# Patient Record
Sex: Female | Born: 1985 | Race: Black or African American | Hispanic: No | Marital: Single | Smoking: Current some day smoker
Health system: Southern US, Community
[De-identification: ages and names within clinical notes are randomized; demographics above are authoritative.]

## PROBLEM LIST (undated history)

## (undated) ENCOUNTER — Ambulatory Visit: Admission: EM | Payer: 59

## (undated) DIAGNOSIS — M419 Scoliosis, unspecified: Secondary | ICD-10-CM

## (undated) DIAGNOSIS — B977 Papillomavirus as the cause of diseases classified elsewhere: Secondary | ICD-10-CM

## (undated) DIAGNOSIS — O1490 Unspecified pre-eclampsia, unspecified trimester: Secondary | ICD-10-CM

## (undated) DIAGNOSIS — O1420 HELLP syndrome (HELLP), unspecified trimester: Secondary | ICD-10-CM

## (undated) DIAGNOSIS — O24419 Gestational diabetes mellitus in pregnancy, unspecified control: Secondary | ICD-10-CM

## (undated) HISTORY — DX: Gestational diabetes mellitus in pregnancy, unspecified control: O24.419

## (undated) HISTORY — DX: Papillomavirus as the cause of diseases classified elsewhere: B97.7

## (undated) HISTORY — PX: COLPOSCOPY: SHX161

## (undated) HISTORY — DX: HELLP syndrome (HELLP), unspecified trimester: O14.20

---
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
## 2007-02-23 ENCOUNTER — Emergency Department: Payer: Self-pay | Admitting: Emergency Medicine

## 2008-01-24 ENCOUNTER — Emergency Department: Payer: Self-pay | Admitting: Emergency Medicine

## 2010-02-13 DIAGNOSIS — O1490 Unspecified pre-eclampsia, unspecified trimester: Secondary | ICD-10-CM

## 2010-02-13 HISTORY — DX: Unspecified pre-eclampsia, unspecified trimester: O14.90

## 2010-10-21 ENCOUNTER — Inpatient Hospital Stay: Payer: Self-pay | Admitting: Obstetrics and Gynecology

## 2011-09-01 ENCOUNTER — Observation Stay: Payer: Self-pay

## 2011-10-11 ENCOUNTER — Observation Stay: Payer: Self-pay

## 2011-10-11 LAB — URINALYSIS, COMPLETE
Bilirubin,UR: NEGATIVE
Glucose,UR: NEGATIVE mg/dL (ref 0–75)
Ketone: NEGATIVE
Nitrite: NEGATIVE
Protein: NEGATIVE
RBC,UR: 4 /HPF (ref 0–5)
Squamous Epithelial: 1
WBC UR: 18 /HPF (ref 0–5)

## 2013-03-22 IMAGING — CT CT CHEST W/ CM
2 series · 15 of 31 positions shown, 19 images · non-contrast
Comparison: none

REASON FOR EXAM: Tachycardia, desaturation
COMMENTS:

[Series 5: soft tissue · axial · 0.80mm/px · z∈[-412,-364]mm · 2 of 101 slices shown]
[im 8/101  mediastinal]
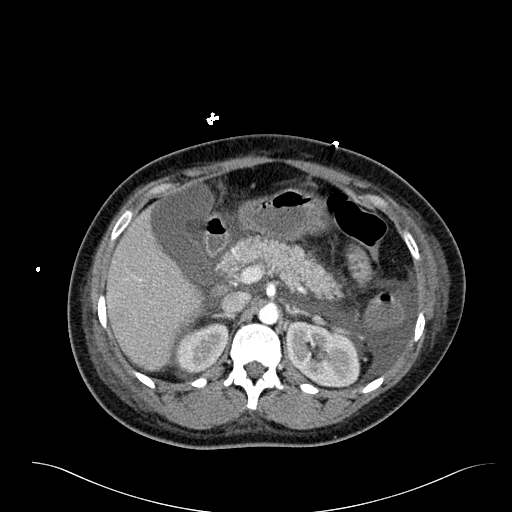
[im 24/101  mediastinal]
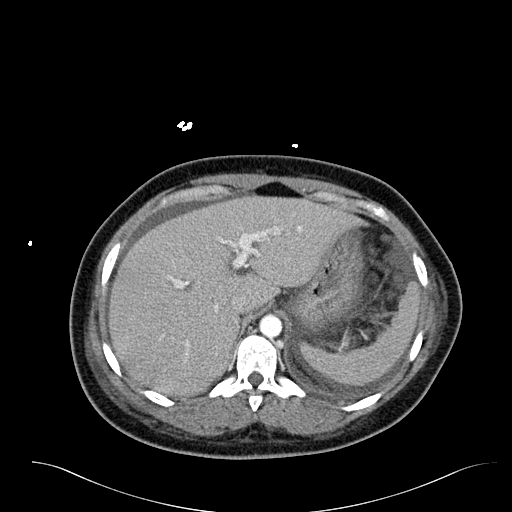

[Series 6: lung windows · axial · 0.80mm/px · z∈[-404,-158]mm · 13 of 98 slices shown, 17 images]
[im 8/98  mediastinal]
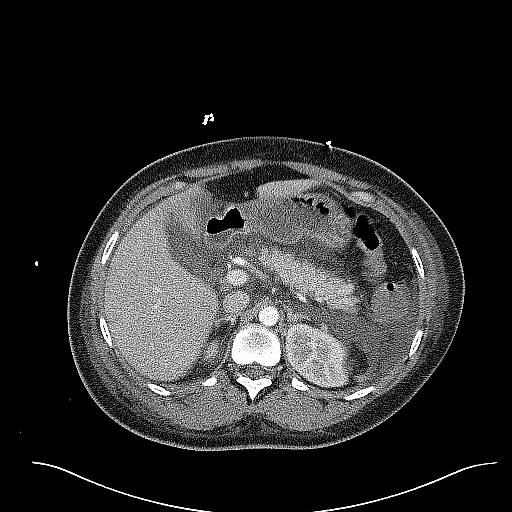
[im 8/98  lung]
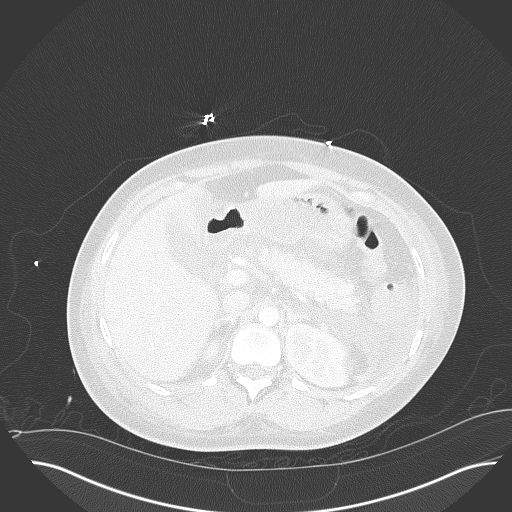
[im 15/98  lung]
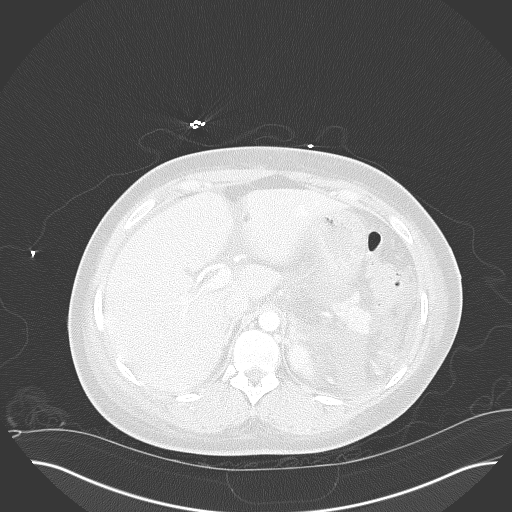
[im 23/98  lung]
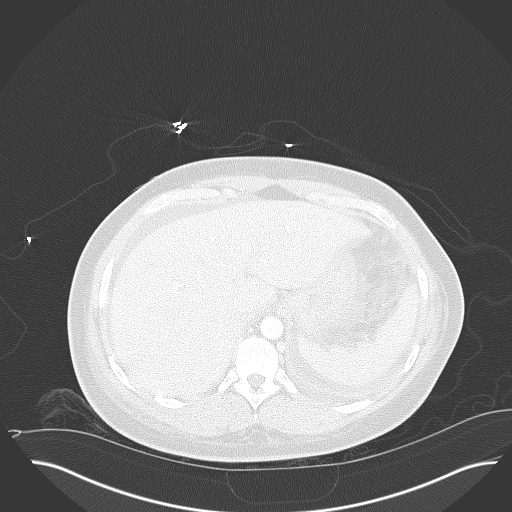
[im 30/98  lung]
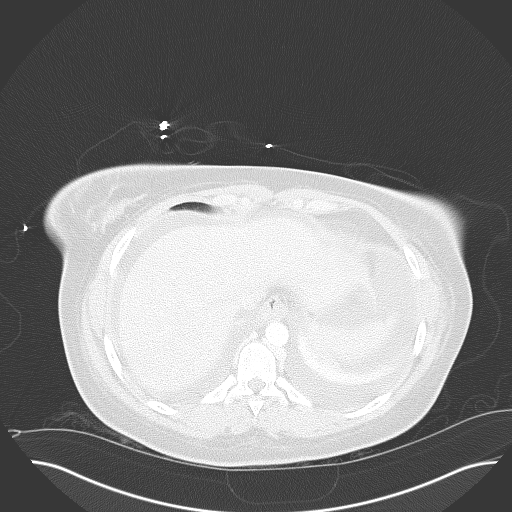
[im 38/98  mediastinal]
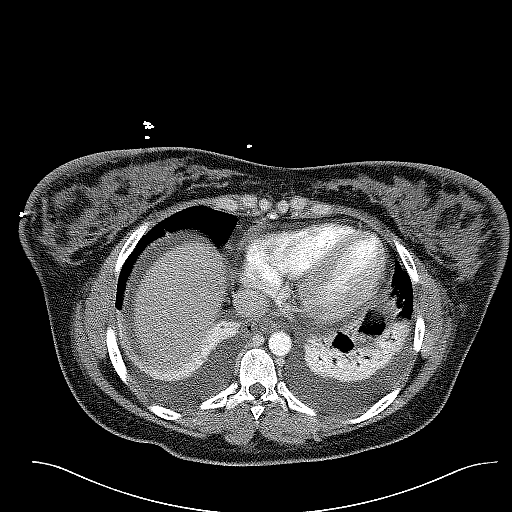
[im 38/98  lung]
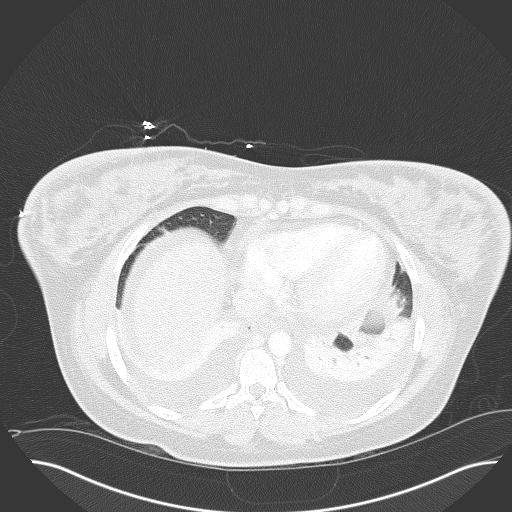
[im 45/98  lung]
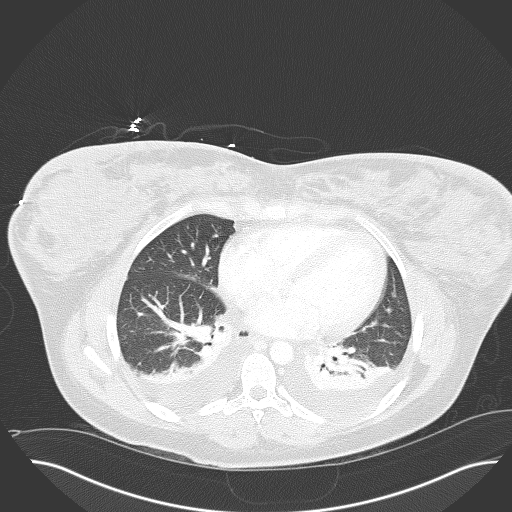
[im 49/98  lung]
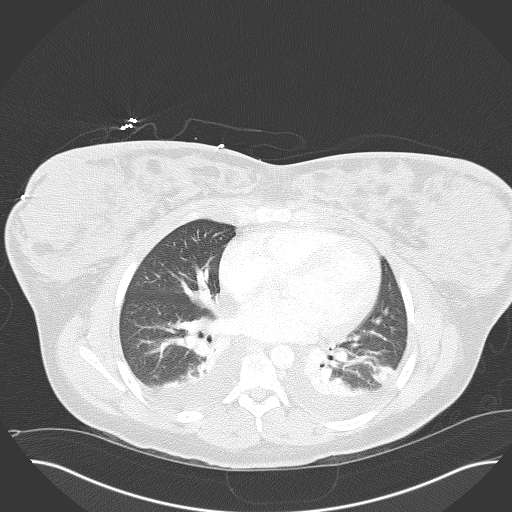
[im 53/98  lung]
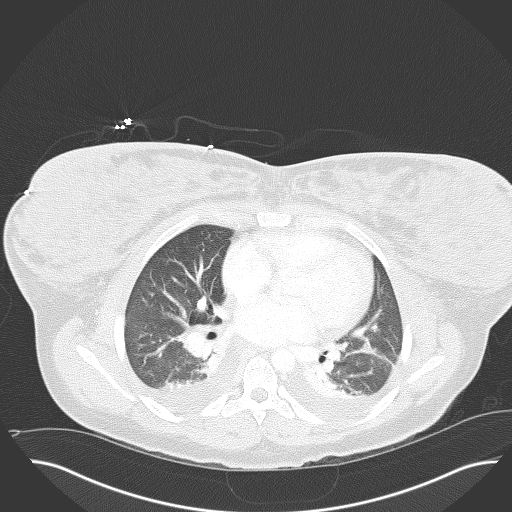
[im 60/98  mediastinal]
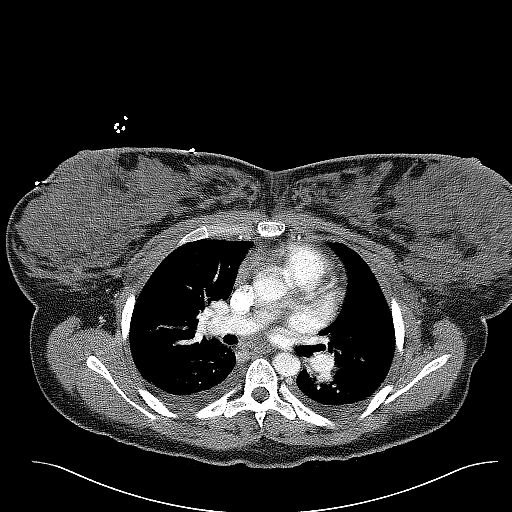
[im 60/98  lung]
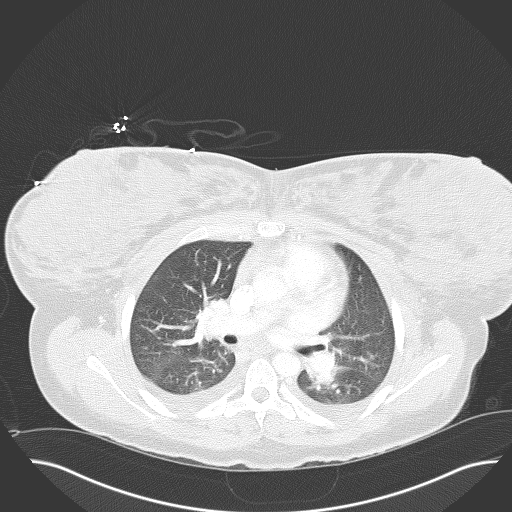
[im 68/98  lung]
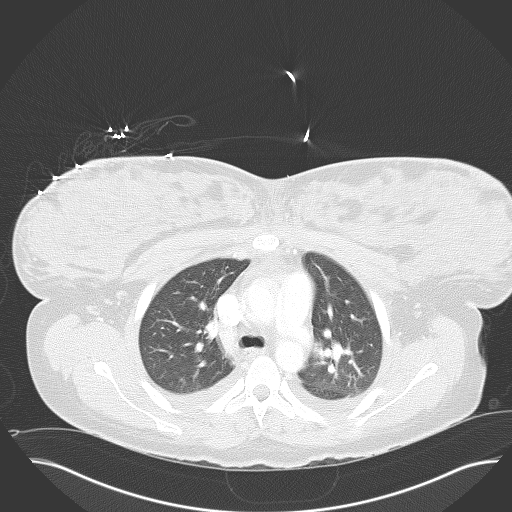
[im 75/98  lung]
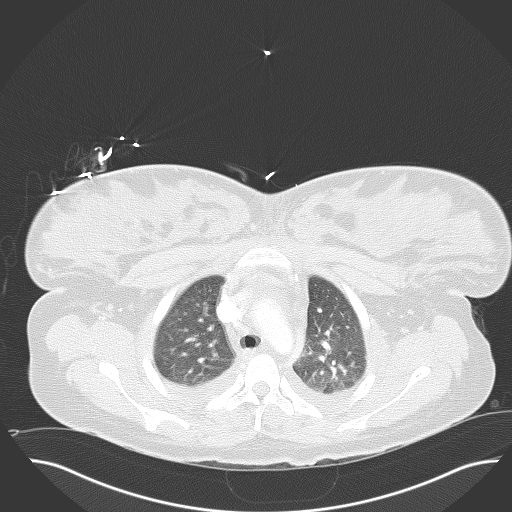
[im 83/98  lung]
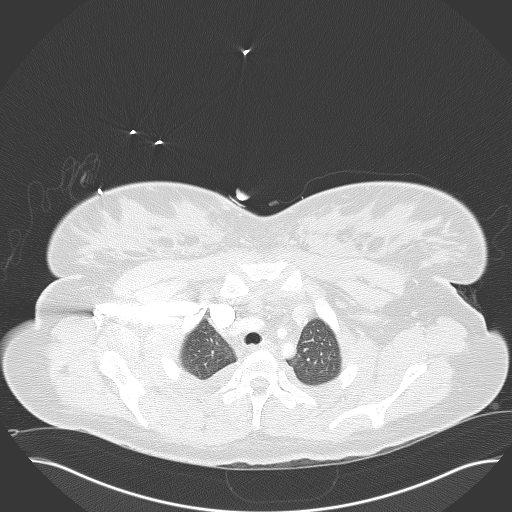
[im 90/98  mediastinal]
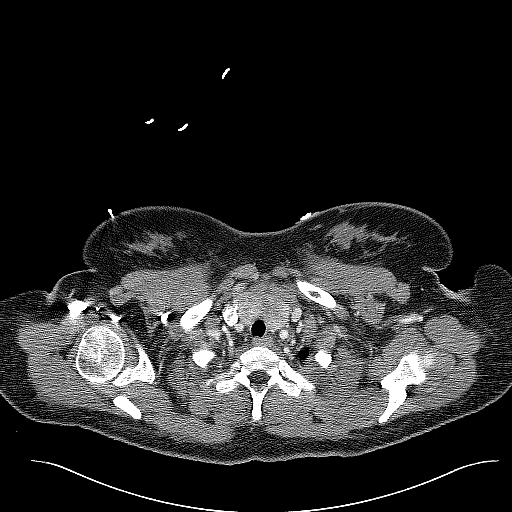
[im 90/98  lung]
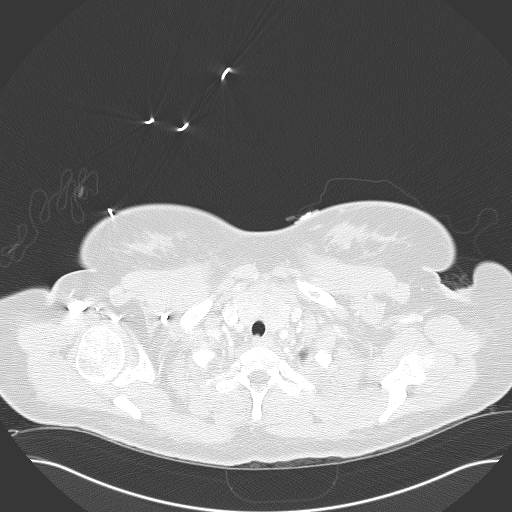

[15 of 31 positions shown; findings below may reference images not displayed]

PROCEDURE:     CT  - CT CHEST WITH CONTRAST  - October 24, 2010  [DATE]

RESULT:     Axial CT scanning was performed through the chest at 3 mm
intervals and slice thicknesses following intravenous administration of 100
cc of Osovue-N4M. Review of multiplanar reconstructed images was performed
separately on the VIA monitor.

Contrast within the pulmonary arterial tree is normal in appearance. I do
not see evidence of an acute pulmonary embolism. The cardiac chambers are
top normal in size. There are moderate sized bilateral pleural effusions.
There is no pericardial effusion. There is bibasilar atelectasis and/or
infiltrate in the lower lobes. I see no pathologic sized mediastinal or
hilar lymph nodes. The caliber of the thoracic aorta is normal. The thyroid
lobes are enlarged and exhibit heterogeneous density.

Within the upper abdomen there is ascites. There is mural enhancement of the
visualized portions of the small bowel. The abdominal aorta enhances well
and exhibits normal caliber. The celiac and superior mesenteric trunks
enhance well where visualized. The inferior vena cava demonstrates normal
enhancement for the early enhancement phase of the study. The observed
portions of the kidneys appear normal. The liver exhibits no focal mass
where visualized. The spleen is not enlarged. The gallbladder appears mildly
distended but I see no calcified stones. The pancreas is mildly prominent
but no discrete mass is identified. One cannot exclude a pancreatic
inflammation given the abdominal fluid.
IMPRESSION: 1. There is no evidence of an acute pulmonary embolism.
2. There are small to moderate sized bilateral pleural effusions with
bibasilar atelectasis versus pneumonia.
3. There is enlargement of the thyroid lobes with heterogeneous enhancement.
4. Within the abdomen there is free fluid. There is also mild mesenteric
edema and there is hyperenhancement of the small bowel which may reflect a
recent hypoperfusion state now with enhanced reperfusion.

A preliminary report was sent to the CCU at the conclusion of the study.

## 2014-05-14 ENCOUNTER — Emergency Department: Admit: 2014-05-14 | Disposition: A | Discharge status: Home / Self Care | Payer: Self-pay | Admitting: Student

## 2014-06-23 NOTE — H&P (Signed)
L&D Evaluation:  History:   HPI 29 yo G2P0100 at 6764w3d gestation by stated EDC of 12/19/2011 presenting with decreased fetal movement.  The patient is particularly concerned given her pregnancy history, she delivered an IUFD 10 months ago at 27 weeks, complicated by severe pre-eclampsia/HELLP syndrome.  [facility] [place].  So far the pregnancy has been uncomplicated per her reports but she has been on HCTZ 50mg  po daily for what sounds like suspected CHTN.  No ctx, no VB, no LOF    Presents with abdominal pain, back pain    Patient's Medical History CHTN?    Patient's Surgical History none    Medications Pre Natal Vitamins    Allergies NKDA    Social History none    Family History Non-Contributory   ROS:   ROS All systems were reviewed.  HEENT, CNS, GI, GU, Respiratory, CV, Renal and Musculoskeletal systems were found to be normal.   Exam:   Vital Signs stable  normotensive today    General no apparent distress    Mental Status clear    Abdomen gravid, non-tender    Estimated Fetal Weight Average for gestational age    Edema no edema    Reflexes 1+    Clonus negative    FHT 150, moderate, no accels, 2 small variables reassuring for 24 weeks   Impression:   Impression decreased fetal movement, reactive NST   Plan:   Plan EFM/NST    Comments - Reactive NST obtained today - Patient reports good movement on arrival to L&D and regular movement is heard during the course of the NST - Patient reassured, discussed given her history we do encourage her to come in early should she have any concerns - Patient has follow up with her primary OB in 3 days    Follow Up Appointment already scheduled   Electronic Signatures: Lorrene ReidStaebler, Ezekiel Menzer M (MD)  (Signed 19-Jul-13 12:43)  Authored: L&D Evaluation   Last Updated: 19-Jul-13 12:43 by Lorrene ReidStaebler, Keiona Jenison M (MD)

## 2014-10-19 DIAGNOSIS — J029 Acute pharyngitis, unspecified: Secondary | ICD-10-CM | POA: Insufficient documentation

## 2014-10-19 DIAGNOSIS — R05 Cough: Secondary | ICD-10-CM | POA: Insufficient documentation

## 2014-10-19 NOTE — ED Notes (Signed)
Pt states decreased appetite, states that her throat has been hurting, congested cough, and states that her ears are closing at times

## 2014-10-20 ENCOUNTER — Encounter: Payer: Self-pay | Admitting: Emergency Medicine

## 2014-10-20 ENCOUNTER — Emergency Department
Admission: EM | Admit: 2014-10-20 | Discharge: 2014-10-20 | Disposition: A | Discharge status: Home / Self Care | Payer: Self-pay | Attending: Emergency Medicine | Admitting: Emergency Medicine

## 2014-10-20 DIAGNOSIS — J029 Acute pharyngitis, unspecified: Secondary | ICD-10-CM

## 2014-10-20 DIAGNOSIS — R05 Cough: Secondary | ICD-10-CM

## 2014-10-20 DIAGNOSIS — R059 Cough, unspecified: Secondary | ICD-10-CM

## 2014-10-20 LAB — MONONUCLEOSIS SCREEN: Mono Screen: NEGATIVE

## 2014-10-20 MED ORDER — DEXAMETHASONE 10 MG/ML FOR PEDIATRIC ORAL USE
10.0000 mg | Freq: Once | INTRAMUSCULAR | Status: AC
  Administered 2014-10-20 (×2): 10 mg via ORAL

## 2014-10-20 MED ORDER — DEXAMETHASONE SODIUM PHOSPHATE 10 MG/ML IJ SOLN
INTRAMUSCULAR | Status: AC
  Administered 2014-10-20: 10 mg via ORAL
  Filled 2014-10-20: qty 1

## 2014-10-20 MED ORDER — IBUPROFEN 600 MG PO TABS
600.0000 mg | ORAL_TABLET | Freq: Once | ORAL | Status: AC
  Administered 2014-10-20: 600 mg via ORAL
  Filled 2014-10-20: qty 1

## 2014-10-20 NOTE — ED Notes (Signed)
[facility] main lab notified to add group A strep, spoke with Revonda Standard, states will add.

## 2014-10-20 NOTE — Discharge Instructions (Signed)
Cough, Adult  A cough is a reflex. It helps you clear your throat and airways. A cough can help heal your body. A cough can last 2 or 3 weeks (acute) or may last more than 8 weeks (chronic). Some common causes of a cough can include an infection, allergy, or a cold. HOME CARE  Only take medicine as told by your doctor.  If given, take your medicines (antibiotics) as told. Finish them even if you start to feel better.  Use a cold steam vaporizer or humidifier in your home. This can help loosen thick spit (secretions).  Sleep so you are almost sitting up (semi-upright). Use pillows to do this. This helps reduce coughing.  Rest as needed.  Stop smoking if you smoke. GET HELP RIGHT AWAY IF:  You have yellowish-white fluid (pus) in your thick spit.  Your cough gets worse.  Your medicine does not reduce coughing, and you are losing sleep.  You cough up blood.  You have trouble breathing.  Your pain gets worse and medicine does not help.  You have a fever. MAKE SURE YOU:   Understand these instructions.  Will watch your condition.  Will get help right away if you are not doing well or get worse. Document Released: 10/13/2010 Document Revised: 06/16/2013 Document Reviewed: 10/13/2010 [facility] Patient Information 2015 [place], [place]. This information is not intended to replace advice given to you by your health care provider. Make sure you discuss any questions you have with your health care provider.  Pharyngitis Pharyngitis is redness, pain, and swelling (inflammation) of your pharynx.  CAUSES  Pharyngitis is usually caused by infection. Most of the time, these infections are from viruses (viral) and are part of a cold. However, sometimes pharyngitis is caused by bacteria (bacterial). Pharyngitis can also be caused by allergies. Viral pharyngitis may be spread from person to person by coughing, sneezing, and personal items or utensils (cups, forks, spoons, toothbrushes).  Bacterial pharyngitis may be spread from person to person by more intimate contact, such as kissing.  SIGNS AND SYMPTOMS  Symptoms of pharyngitis include:   Sore throat.   Tiredness (fatigue).   Low-grade fever.   Headache.  Joint pain and muscle aches.  Skin rashes.  Swollen lymph nodes.  Plaque-like film on throat or tonsils (often seen with bacterial pharyngitis). DIAGNOSIS  Your health care provider will ask you questions about your illness and your symptoms. Your medical history, along with a physical exam, is often all that is needed to diagnose pharyngitis. Sometimes, a rapid strep test is done. Other lab tests may also be done, depending on the suspected cause.  TREATMENT  Viral pharyngitis will usually get better in 3-4 days without the use of medicine. Bacterial pharyngitis is treated with medicines that kill germs (antibiotics).  HOME CARE INSTRUCTIONS   Drink enough water and fluids to keep your urine clear or pale yellow.   Only take over-the-counter or prescription medicines as directed by your health care provider:   If you are prescribed antibiotics, make sure you finish them even if you start to feel better.   Do not take aspirin.   Get lots of rest.   Gargle with 8 oz of salt water ( tsp of salt per 1 qt of water) as often as every 1-2 hours to soothe your throat.   Throat lozenges (if you are not at risk for choking) or sprays may be used to soothe your throat. SEEK MEDICAL CARE IF:   You have large, tender  lumps in your neck.  You have a rash.  You cough up green, yellow-Pedretti, or bloody spit. SEEK IMMEDIATE MEDICAL CARE IF:   Your neck becomes stiff.  You drool or are unable to swallow liquids.  You vomit or are unable to keep medicines or liquids down.  You have severe pain that does not go away with the use of recommended medicines.  You have trouble breathing (not caused by a stuffy nose). MAKE SURE YOU:   Understand these  instructions.  Will watch your condition.  Will get help right away if you are not doing well or get worse. Document Released: 01/30/2005 Document Revised: 11/20/2012 Document Reviewed: 10/07/2012 [facility] Patient Information 2015 [place], [place]. This information is not intended to replace advice given to you by your health care provider. Make sure you discuss any questions you have with your health care provider.  Salt Water Gargle This solution will help make your mouth and throat feel better. HOME CARE INSTRUCTIONS   Mix 1 teaspoon of salt in 8 ounces of warm water.  Gargle with this solution as much or often as you need or as directed. Swish and gargle gently if you have any sores or wounds in your mouth.  Do not swallow this mixture. Document Released: 11/04/2003 Document Revised: 04/24/2011 Document Reviewed: 03/27/2008 [facility] Patient Information 2015 [place], [place]. This information is not intended to replace advice given to you by your health care provider. Make sure you discuss any questions you have with your health care provider.

## 2014-10-20 NOTE — ED Provider Notes (Signed)
[facility] Emergency Department Provider Note  ____________________________________________  Time seen: Approximately 154 AM  I have reviewed the triage vital signs and the nursing notes.   HISTORY  Chief Complaint Sore Throat    HPI Kathleen Leach is a 29 y.o. female who comes in with 1 week of cough. The patient reports that at that time she had a tickle in her throat but it went away. She reports 2 days ago she had a popsicle and her throat started feeling swollen right afterward. The patient reports that since then her throat has been hurting and the roof of her mouth feels sore. The denies any sick contacts and reports that she has a backache today. She has had a continued cough since it started last week. The patient has not taken anything for her throat pain and reports that it is a 6-7 out of 10 in intensity.    History reviewed. No pertinent past medical history.  There are no active problems to display for this patient.   History reviewed. No pertinent past surgical history.  No current outpatient prescriptions on file.  Allergies Review of patient's allergies indicates not on file.  History reviewed. No pertinent family history.  Social History Social History  Substance Use Topics  . Smoking status: Never Smoker   . Smokeless tobacco: Never Used  . Alcohol Use: No     Comment: occ    Review of Systems Constitutional: No fever/chills Eyes: No visual changes. ENT:  sore throat, sinus disease Cardiovascular: Denies chest pain. Respiratory: Cough, Denies shortness of breath. Gastrointestinal: No abdominal pain.  No nausea, no vomiting.  No diarrhea.  No constipation. Genitourinary: Negative for dysuria. Musculoskeletal: Negative for back pain. Skin: Negative for rash. Neurological: Headache  10-point ROS otherwise negative.  ____________________________________________   PHYSICAL EXAM:  VITAL SIGNS: ED Triage Vitals  Enc  Vitals Group     BP 10/19/14 2232 119/81 mmHg     Pulse Rate 10/19/14 2232 85     Resp 10/19/14 2232 18     Temp 10/19/14 2232 98.3 F (36.8 C)     Temp Source 10/19/14 2232 Oral     SpO2 10/19/14 2232 100 %     Weight 10/19/14 2232 155 lb (70.308 kg)     Height 10/19/14 2232 5\' 7"  (1.702 m)     Head Cir --      Peak Flow --      Pain Score 10/19/14 2233 7     Pain Loc --      Pain Edu? --      Excl. in GC? --     Constitutional: Alert and oriented. Well appearing and in mild distress. Eyes: Conjunctivae are normal. PERRL. EOMI. Head: Atraumatic. Nose: No congestion/rhinnorhea. Mouth/Throat: Mucous membranes are moist.  Oropharynx non-erythematous. Cardiovascular: Normal rate, regular rhythm. Grossly normal heart sounds.  Good peripheral circulation. Respiratory: Normal respiratory effort.  No retractions. Lungs CTAB. Gastrointestinal: Soft and nontender. No distention. Positive bowel sounds Musculoskeletal: No lower extremity tenderness nor edema.  Neurologic:  Normal speech and language.  Skin:  Skin is warm, dry and intact. No rash noted. Psychiatric: Mood and affect are normal.   ____________________________________________   LABS (all labs ordered are listed, but only abnormal results are displayed)  Labs Reviewed  CULTURE, GROUP A STREP ([facility] ONLY)  MONONUCLEOSIS SCREEN   ____________________________________________  EKG  none ____________________________________________  RADIOLOGY  none ____________________________________________   PROCEDURES  Procedure(s) performed: None  Critical Care performed:  No  ____________________________________________   INITIAL IMPRESSION / ASSESSMENT AND PLAN / ED COURSE  Pertinent labs & imaging results that were available during my care of the patient were reviewed by me and considered in my medical decision making (see chart for details).  This is a 29 year old female who comes in with a sore throat as well as  a cough for the past few days. The patient had a strep test done which was negative as well as a Monospot that was negative. The patient's throat is not erythematous does not have any significant swelling or vesicles. I informed the patient that should she have sinus disease post nasal drip can also cause some sore throat. I did give the patient a dose of ibuprofen as well as Decadron. I will discharge the patient to have her follow-up with her primary care physician or the acute care clinic. They'll as the patient had no further questions or concerns she had no fevers no blurred vision no neck pain or other concerns. ____________________________________________   FINAL CLINICAL IMPRESSION(S) / ED DIAGNOSES  Final diagnoses:  Sore throat  Cough      Rebecka Apley, MD 10/20/14 [phone]

## 2014-10-20 NOTE — ED Notes (Signed)
Rapid strep test negative, Dr. Zenda Alpers notified

## 2014-10-21 LAB — POCT RAPID STREP A: STREPTOCOCCUS, GROUP A SCREEN (DIRECT): NEGATIVE

## 2014-10-22 LAB — CULTURE, GROUP A STREP (THRC)

## 2015-02-08 ENCOUNTER — Encounter: Payer: Self-pay | Admitting: Emergency Medicine

## 2015-02-08 ENCOUNTER — Emergency Department: Payer: Medicaid Other

## 2015-02-08 ENCOUNTER — Emergency Department
Admission: EM | Admit: 2015-02-08 | Discharge: 2015-02-08 | Disposition: A | Discharge status: Home / Self Care | Payer: Self-pay | Attending: Emergency Medicine | Admitting: Emergency Medicine

## 2015-02-08 DIAGNOSIS — Y9389 Activity, other specified: Secondary | ICD-10-CM | POA: Insufficient documentation

## 2015-02-08 DIAGNOSIS — Y998 Other external cause status: Secondary | ICD-10-CM | POA: Insufficient documentation

## 2015-02-08 DIAGNOSIS — Y92410 Unspecified street and highway as the place of occurrence of the external cause: Secondary | ICD-10-CM | POA: Insufficient documentation

## 2015-02-08 DIAGNOSIS — S29019A Strain of muscle and tendon of unspecified wall of thorax, initial encounter: Secondary | ICD-10-CM

## 2015-02-08 DIAGNOSIS — S29012A Strain of muscle and tendon of back wall of thorax, initial encounter: Secondary | ICD-10-CM | POA: Insufficient documentation

## 2015-02-08 HISTORY — DX: Scoliosis, unspecified: M41.9

## 2015-02-08 MED ORDER — IBUPROFEN 800 MG PO TABS
ORAL_TABLET | ORAL | Status: AC
  Filled 2015-02-08: qty 1

## 2015-02-08 MED ORDER — IBUPROFEN 800 MG PO TABS
800.0000 mg | ORAL_TABLET | Freq: Once | ORAL | Status: AC
  Administered 2015-02-08: 800 mg via ORAL

## 2015-02-08 MED ORDER — CYCLOBENZAPRINE HCL 5 MG PO TABS: 5.0000 mg | ORAL_TABLET | Freq: Three times a day (TID) | ORAL | Status: DC | PRN

## 2015-02-08 MED ORDER — IBUPROFEN 600 MG PO TABS: 600.0000 mg | ORAL_TABLET | Freq: Four times a day (QID) | ORAL | Status: DC | PRN
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
## 2015-02-08 NOTE — Discharge Instructions (Signed)
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
## 2016-01-09 ENCOUNTER — Emergency Department
Admission: EM | Admit: 2016-01-09 | Discharge: 2016-01-09 | Disposition: A | Discharge status: Home / Self Care | Payer: Medicaid Other | Attending: Student in an Organized Health Care Education/Training Program | Admitting: Student in an Organized Health Care Education/Training Program

## 2016-01-09 ENCOUNTER — Encounter: Payer: Self-pay | Admitting: Emergency Medicine

## 2016-01-09 DIAGNOSIS — K029 Dental caries, unspecified: Secondary | ICD-10-CM | POA: Insufficient documentation

## 2016-01-09 MED ORDER — PENICILLIN V POTASSIUM 500 MG PO TABS: 500.0000 mg | ORAL_TABLET | Freq: Four times a day (QID) | ORAL | 0 refills | Status: DC

## 2016-01-09 MED ORDER — TRAMADOL HCL 50 MG PO TABS: 50.0000 mg | ORAL_TABLET | Freq: Four times a day (QID) | ORAL | 0 refills | Status: DC | PRN

## 2016-01-09 NOTE — Discharge Instructions (Signed)
Call and make an appointment with the dentist. Start antibiotics today. Penicillin 500 mg 4 times a day for 7 days. Tramadol every 6 hours as needed for moderate pain. You cannot take the pain medication while driving or working. You need to continue ibuprofen only as directed 800 mg 3 times a day with food.

## 2016-01-09 NOTE — ED Provider Notes (Signed)
[facility] Emergency Department Provider Note  ____________________________________________   First MD Initiated Contact with Patient 01/09/16 1312     (approximate)  I have reviewed the triage vital signs and the nursing notes.   HISTORY  Chief Complaint Dental Pain   HPI Kathleen Leach is a 30 y.o. female is here complaining of dental pain. Patient states this started couple weeks ago and has gotten worse. Patient states that she [facility] and was told that it would be 3-4 weeks before she can be seen. Patient did not make an appointment at that time. She has been taking ibuprofen and today has taken an ibuprofen 800 mg which did not work so she took ibuprofen 600 mg and now her stomach is upset. She denies any fever or chills. She has not had any facial swelling. Currently she rates her pain as a 4 out of 10.   Past Medical History:  Diagnosis Date  . Scoliosis     There are no active problems to display for this patient.   No past surgical history on file.  Prior to Admission medications   Medication Sig Start Date End Date Taking? Authorizing Provider  penicillin v potassium (VEETID) 500 MG tablet Take 1 tablet (500 mg total) by mouth 4 (four) times daily. 01/09/16   Tommi Rumpshonda L Summers, PA-C  traMADol (ULTRAM) 50 MG tablet Take 1 tablet (50 mg total) by mouth every 6 (six) hours as needed for moderate pain. 01/09/16   Tommi Rumpshonda L Summers, PA-C    Allergies Patient has no known allergies.  No family history on file.  Social History Social History  Substance Use Topics  . Smoking status: Never Smoker  . Smokeless tobacco: Never Used  . Alcohol use No     Comment: occ    Review of Systems Constitutional: No fever/chills Eyes: No visual changes. ENT: No sore throat. Positive dental pain. Cardiovascular: Denies chest pain. Respiratory: Denies shortness of breath. Gastrointestinal:   No nausea, no vomiting.     Musculoskeletal: Negative for back pain. Skin: Negative for rash. Neurological: Negative for headaches, focal weakness or numbness.  10-point ROS otherwise negative.  ____________________________________________   PHYSICAL EXAM:  VITAL SIGNS: ED Triage Vitals  Enc Vitals Group     BP 01/09/16 1233 120/83     Pulse Rate 01/09/16 1233 94     Resp 01/09/16 1233 16     Temp 01/09/16 1233 98.7 F (37.1 C)     Temp Source 01/09/16 1233 Oral     SpO2 01/09/16 1233 100 %     Weight 01/09/16 1221 156 lb (70.8 kg)     Height 01/09/16 1221 5\' 6"  (1.676 m)     Head Circumference --      Peak Flow --      Pain Score 01/09/16 1221 4     Pain Loc --      Pain Edu? --      Excl. in GC? --     Constitutional: Alert and oriented. Well appearing and in no acute distress. Eyes: Conjunctivae are normal. PERRL. EOMI. Head: Atraumatic. Nose: No congestion/rhinnorhea. Mouth/Throat: Mucous membranes are moist.  Oropharynx non-erythematous. Left lower molar with caries present on the posterior aspect of the tooth. There is some soft tissue and gum swelling but no obvious sign of abscess. There is no drainage. Neck: No stridor.   Hematological/Lymphatic/Immunilogical: No cervical lymphadenopathy. Cardiovascular: Normal rate, regular rhythm. Grossly normal heart sounds.  Good  peripheral circulation. Respiratory: Normal respiratory effort.  No retractions. Lungs CTAB. Gastrointestinal: Soft and nontender. No distention. Musculoskeletal: Moves upper and lower extremities without any difficulty. Normal gait was noted. Neurologic:  Normal speech and language. No gross focal neurologic deficits are appreciated. No gait instability. Skin:  Skin is warm, dry and intact. No rash noted. Psychiatric: Mood and affect are normal. Speech and behavior are normal.  ____________________________________________   LABS (all labs ordered are listed, but only abnormal results are displayed)  Labs Reviewed - No  data to display   PROCEDURES  Procedure(s) performed: None  Procedures  Critical Care performed: No  ____________________________________________   INITIAL IMPRESSION / ASSESSMENT AND PLAN / ED COURSE  Pertinent labs & imaging results that were available during my care of the patient were reviewed by me and considered in my medical decision making (see chart for details).    Clinical Course    Patient was given information about dental clinics in the area and told to take first available appointment. Patient was given a prescription for Pen-Vee K 500 mg 4 times a day for 7 days and tramadol 50 milligrams one every 6 as needed for moderate pain. Patient was discouraged to take high doses of ibuprofen such issues been doing. She is also urged to eat when taking medication. Patient is encouraged to call dental clinics beginning Monday morning.  ____________________________________________   FINAL CLINICAL IMPRESSION(S) / ED DIAGNOSES  Final diagnoses:  Pain due to dental caries      NEW MEDICATIONS STARTED DURING THIS VISIT:  Discharge Medication List as of 01/09/2016  1:31 PM    START taking these medications   Details  penicillin v potassium (VEETID) 500 MG tablet Take 1 tablet (500 mg total) by mouth 4 (four) times daily., Starting Sun 01/09/2016, Print    traMADol (ULTRAM) 50 MG tablet Take 1 tablet (50 mg total) by mouth every 6 (six) hours as needed for moderate pain., Starting Sun 01/09/2016, Print         Note:  This document was prepared using Dragon voice recognition software and may include unintentional dictation errors.    Tommi Rumpshonda L Summers, PA-C 01/09/16 1446    Willy EddyPatrick Robinson, MD 01/09/16 [phone]

## 2016-01-09 NOTE — ED Triage Notes (Signed)
Dental pain that started a few weeks ago and has gotten worse

## 2016-01-09 NOTE — ED Notes (Signed)
States she broke a tooth about 1-2 weeks ago states pain returned couple of days ago    Min relief with ibu

## 2016-01-14 ENCOUNTER — Encounter: Payer: Self-pay | Admitting: Emergency Medicine

## 2016-01-14 ENCOUNTER — Emergency Department
Admission: EM | Admit: 2016-01-14 | Discharge: 2016-01-14 | Disposition: A | Discharge status: Home / Self Care | Payer: Medicaid Other | Attending: Emergency Medicine | Admitting: Emergency Medicine

## 2016-01-14 DIAGNOSIS — J111 Influenza due to unidentified influenza virus with other respiratory manifestations: Secondary | ICD-10-CM | POA: Insufficient documentation

## 2016-01-14 DIAGNOSIS — Z79899 Other long term (current) drug therapy: Secondary | ICD-10-CM | POA: Insufficient documentation

## 2016-01-14 DIAGNOSIS — F1290 Cannabis use, unspecified, uncomplicated: Secondary | ICD-10-CM | POA: Insufficient documentation

## 2016-01-14 DIAGNOSIS — R69 Illness, unspecified: Secondary | ICD-10-CM

## 2016-01-14 LAB — POCT PREGNANCY, URINE: PREG TEST UR: NEGATIVE

## 2016-01-14 MED ORDER — OSELTAMIVIR PHOSPHATE 75 MG PO CAPS: 75.0000 mg | ORAL_CAPSULE | Freq: Two times a day (BID) | ORAL | 0 refills | Status: AC

## 2016-01-14 MED ORDER — AMOXICILLIN 500 MG PO CAPS: 500.0000 mg | ORAL_CAPSULE | Freq: Two times a day (BID) | ORAL | 0 refills | Status: DC

## 2016-01-14 MED ORDER — NAPROXEN 500 MG PO TBEC: 500.0000 mg | DELAYED_RELEASE_TABLET | Freq: Two times a day (BID) | ORAL | 0 refills | Status: DC

## 2016-01-14 MED ORDER — KETOROLAC TROMETHAMINE 60 MG/2ML IM SOLN
60.0000 mg | Freq: Once | INTRAMUSCULAR | Status: AC
  Administered 2016-01-14: 60 mg via INTRAMUSCULAR
  Filled 2016-01-14: qty 2

## 2016-01-14 NOTE — ED Notes (Signed)
AAOx3.  Skin warm and dry. NAD.  Sinus congestion noted. 

## 2016-01-14 NOTE — ED Triage Notes (Signed)
Seen in ED on Sunday for dental pain, prescribed tramadol and PCN.  States having nausea and diarrhea with taking PCN.  Has tried peptobismol with minimal effects.  Also c/o sinus congestion, cough, chills x 2-3 days.

## 2016-01-15 NOTE — ED Provider Notes (Signed)
[facility] Emergency Department Provider Note  ____________________________________________   First MD Initiated Contact with Patient 01/14/16 2017     (approximate)  I have reviewed the triage vital signs and the nursing notes.   HISTORY  Chief Complaint Generalized Body Aches and Cough    HPI Kathleen Leach is a 30 y.o. female presenting with myalgias, headache, congestion, rhinorrhea and intermittent cough for the past 2-3 days. Patient was seen on January 09, 2016 for dental pain along the left jaw. Patient is requesting a change to her antibiotic regimen, as she has experienced diarrhea immediately after starting Penicillin V for suspected dental abscess. Patient also states that her tramadol has been ineffective for pain. She currently rates her dental pain at 7/10. She denies fever. She still has not made an appointment at the dentist. Patient denies chest pain and shortness of breath. She denies recent travel   Past Medical History:  Diagnosis Date  . Scoliosis     There are no active problems to display for this patient.   History reviewed. No pertinent surgical history.  Prior to Admission medications   Medication Sig Start Date End Date Taking? Authorizing Provider  amoxicillin (AMOXIL) 500 MG capsule Take 1 capsule (500 mg total) by mouth 2 (two) times daily. 01/14/16   Orvil FeilJaclyn M Johnta Couts, PA-C  naproxen (EC NAPROSYN) 500 MG EC tablet Take 1 tablet (500 mg total) by mouth 2 (two) times daily with a meal. 01/14/16 01/13/17  Orvil FeilJaclyn M Magda Muise, PA-C  oseltamivir (TAMIFLU) 75 MG capsule Take 1 capsule (75 mg total) by mouth 2 (two) times daily. 01/14/16 01/19/16  Orvil FeilJaclyn M Jaystin Mcgarvey, PA-C  penicillin v potassium (VEETID) 500 MG tablet Take 1 tablet (500 mg total) by mouth 4 (four) times daily. 01/09/16   Tommi Rumpshonda L Summers, PA-C  traMADol (ULTRAM) 50 MG tablet Take 1 tablet (50 mg total) by mouth every 6 (six) hours as needed for moderate pain. 01/09/16   Tommi Rumpshonda L  Summers, PA-C    Allergies Patient has no known allergies.  No family history on file.  Social History Social History  Substance Use Topics  . Smoking status: Never Smoker  . Smokeless tobacco: Never Used  . Alcohol use No     Comment: occ    Review of Systems Constitutional: Chills Eyes: No visual changes. ENT: No sore throat. Cardiovascular: Denies chest pain. Respiratory: Denies shortness of breath. Has intermittent cough Gastrointestinal: Has diarrhea Genitourinary: Negative for dysuria. Musculoskeletal: Negative for back pain. Skin: Negative for rash. Neurological: Patient has had headache.  10-point ROS otherwise negative.  ____________________________________________   PHYSICAL EXAM:  VITAL SIGNS: ED Triage Vitals  Enc Vitals Group     BP 01/14/16 2023 (!) 141/92     Pulse Rate 01/14/16 2023 75     Resp 01/14/16 2023 16     Temp 01/14/16 2023 98.3 F (36.8 C)     Temp Source 01/14/16 2023 Oral     SpO2 01/14/16 2023 98 %     Weight 01/14/16 2024 156 lb (70.8 kg)     Height 01/14/16 2024 5\' 6"  (1.676 m)     Head Circumference --      Peak Flow --      Pain Score 01/14/16 2024 3     Pain Loc --      Pain Edu? --      Excl. in GC? --     Constitutional: Alert and oriented. Well appearing and in no acute  distress. Eyes: Conjunctivae are normal. PERRL. EOMI. Head: Atraumatic. Nose: Patient has congestion and rhinorrhea. Mouth/Throat: Mucous membranes are moist.  Oropharynx non-erythematous.There is no palpable abscess along the left jaw. There is no erythema of the skin overlying the left jaw. Neck:Full range of motion. Hematological/Lymphatic/Immunilogical: No cervical lymphadenopathy. Cardiovascular: Normal rate, regular rhythm. Grossly normal heart sounds.  Good peripheral circulation. Respiratory: Normal respiratory effort.  No retractions. Lungs CTAB. Gastrointestinal: Soft and nontender. No distention. No abdominal bruits. No CVA  tenderness. Musculoskeletal: No lower extremity tenderness nor edema.  No joint effusions. Neurologic:  Normal speech and language. No gross focal neurologic deficits are appreciated. No gait instability. Skin:  Skin is warm, dry and intact. No rash noted. Psychiatric: Mood and affect are normal. Speech and behavior are normal.  ____________________________________________   LABS (all labs ordered are listed, but only abnormal results are displayed)  Labs Reviewed  POC URINE PREG, ED  POCT PREGNANCY, URINE     Procedures  Toradol   ____________________________________________   INITIAL IMPRESSION / ASSESSMENT AND PLAN / ED COURSE  Pertinent labs & imaging results that were available during my care of the patient were reviewed by me and considered in my medical decision making (see chart for details).  Assessment and plan: Patient's antibiotic regimen was switched from penicillin V to amoxicillin. I started patient empirically on Tamiflu for viral symptoms consistent with influenza. Urine pregnancy test was negative. Patient was prescribed naproxen to be used as needed for dental pain. I did not prescribe additional narcotics for dental pain. All patient questions were answered. Strict return precautions were given. Patient is currently afebrile and vital signs are stable besides mild hypertension.   Clinical Course      ____________________________________________   FINAL CLINICAL IMPRESSION(S) / ED DIAGNOSES  Final diagnoses:  Influenza-like illness      NEW MEDICATIONS STARTED DURING THIS VISIT:  Discharge Medication List as of 01/14/2016  9:35 PM    START taking these medications   Details  amoxicillin (AMOXIL) 500 MG capsule Take 1 capsule (500 mg total) by mouth 2 (two) times daily., Starting Fri 01/14/2016, Print    naproxen (EC NAPROSYN) 500 MG EC tablet Take 1 tablet (500 mg total) by mouth 2 (two) times daily with a meal., Starting Fri 01/14/2016, Until  Sat 01/13/2017, Print    oseltamivir (TAMIFLU) 75 MG capsule Take 1 capsule (75 mg total) by mouth 2 (two) times daily., Starting Fri 01/14/2016, Until Wed 01/19/2016, Print         Note:  This document was prepared using Dragon voice recognition software and may include unintentional dictation errors.    Orvil FeilJaclyn M Shereka Lafortune, PA-C 01/15/16 0023    Phineas SemenGraydon Goodman, MD 01/16/16 [phone]

## 2016-01-17 ENCOUNTER — Encounter: Payer: Self-pay | Admitting: Emergency Medicine

## 2016-01-17 ENCOUNTER — Emergency Department: Payer: Self-pay

## 2016-01-17 ENCOUNTER — Emergency Department
Admission: EM | Admit: 2016-01-17 | Discharge: 2016-01-17 | Disposition: A | Discharge status: Home / Self Care | Payer: Self-pay | Attending: Emergency Medicine | Admitting: Emergency Medicine

## 2016-01-17 DIAGNOSIS — K0381 Cracked tooth: Secondary | ICD-10-CM

## 2016-01-17 DIAGNOSIS — R110 Nausea: Secondary | ICD-10-CM

## 2016-01-17 DIAGNOSIS — R197 Diarrhea, unspecified: Secondary | ICD-10-CM | POA: Insufficient documentation

## 2016-01-17 DIAGNOSIS — Z791 Long term (current) use of non-steroidal anti-inflammatories (NSAID): Secondary | ICD-10-CM | POA: Insufficient documentation

## 2016-01-17 DIAGNOSIS — J069 Acute upper respiratory infection, unspecified: Secondary | ICD-10-CM

## 2016-01-17 DIAGNOSIS — R079 Chest pain, unspecified: Secondary | ICD-10-CM

## 2016-01-17 DIAGNOSIS — N898 Other specified noninflammatory disorders of vagina: Secondary | ICD-10-CM

## 2016-01-17 DIAGNOSIS — R195 Other fecal abnormalities: Secondary | ICD-10-CM

## 2016-01-17 HISTORY — DX: Unspecified pre-eclampsia, unspecified trimester: O14.90

## 2016-01-17 LAB — BASIC METABOLIC PANEL
Anion gap: 6 (ref 5–15)
BUN: 14 mg/dL (ref 6–20)
CALCIUM: 9.6 mg/dL (ref 8.9–10.3)
CO2: 28 mmol/L (ref 22–32)
CREATININE: 0.82 mg/dL (ref 0.44–1.00)
Chloride: 102 mmol/L (ref 101–111)
GFR calc Af Amer: >60 mL/min (ref >60)
Glucose, Bld: 115 mg/dL — ABNORMAL HIGH (ref 65–99)
Potassium: 3.6 mmol/L (ref 3.5–5.1)
SODIUM: 136 mmol/L (ref 135–145)

## 2016-01-17 LAB — CBC
HCT: 37.2 % (ref 35.0–47.0)
Hemoglobin: 13 g/dL (ref 12.0–16.0)
MCH: 31 pg (ref 26.0–34.0)
MCHC: 34.9 g/dL (ref 32.0–36.0)
MCV: 88.8 fL (ref 80.0–100.0)
PLATELETS: 335 10*3/uL (ref 150–440)
RBC: 4.18 MIL/uL (ref 3.80–5.20)
RDW: 13.8 % (ref 11.5–14.5)
WBC: 9.3 10*3/uL (ref 3.6–11.0)

## 2016-01-17 LAB — TROPONIN I: Troponin I: <0.03 ng/mL (ref <0.03)

## 2016-01-17 MED ORDER — ONDANSETRON 4 MG PO TBDP: 4.0000 mg | ORAL_TABLET | Freq: Three times a day (TID) | ORAL | 0 refills | Status: DC | PRN

## 2016-01-17 MED ORDER — FLUCONAZOLE 150 MG PO TABS: 150.0000 mg | ORAL_TABLET | Freq: Once | ORAL | 0 refills | Status: AC

## 2016-01-17 MED ORDER — LOPERAMIDE HCL 2 MG PO TABS: 2.0000 mg | ORAL_TABLET | Freq: Four times a day (QID) | ORAL | 0 refills | Status: DC | PRN

## 2016-01-17 MED ORDER — CALCIUM CARBONATE ANTACID 500 MG PO CHEW: 1.0000 | CHEWABLE_TABLET | Freq: Every day | ORAL | 3 refills | Status: DC

## 2016-01-17 NOTE — ED Provider Notes (Signed)
[facility] Emergency Department Provider Note  ____________________________________________  Time seen: Approximately 8:35 AM  I have reviewed the triage vital signs and the nursing notes.   HISTORY  Chief Complaint Chest Pain    HPI Kathleen Leach is a 30 y.o. female , nonpregnant, presenting for congestion and rhinorrhea, nausea without vomiting, loose stool, epigastric and chest discomfort, and a cracked tooth. This is the patient's third visit to the emergency department this week. She was first seen for dental pain, and started on penicillin and tramadol with instructions to follow up with a dentist.  2 days ago, she was seen for GI upset that she related to her penicillin, and was given a prescription for amoxicillin which she has not started. She reports that in the meantime she has developed congestion and rhinorrhea without sore throat or ear pain or significant cough. No fevers or chills. She has continued to have nausea without vomiting, and 2-3 loose stools that are nonbloody non-watery daily. She has not tried anything for the symptoms. She has also developed several 30 minute episodes of a squeezing sensation in the epigastrium and chest that self resolved over the past 2 days. She has not had any associated shortness of breath, lightheadedness or fainting, palpitations. She also reports a change in her vaginal discharge since starting the penicillin, for which she has tried Biomedical engineerVagisil. She does not know her last period, but has taken a pregnancy test at home which was negative, and had a negative pregnancy test on 12/1 when she was last seen.   Past Medical History:  Diagnosis Date  . Preeclampsia 2012  . Scoliosis     There are no active problems to display for this patient.   History reviewed. No pertinent surgical history.  Current Outpatient Rx  . Order #: [id number] Class: Print  . Order #: [id number] Class: Print  . Order #: [id number] Class: Print   . Order #: [id number] Class: Print  . Order #: [id number] Class: Print  . Order #: [id number] Class: Print  . Order #: [id number] Class: Print  . Order #: [id number] Class: Print  . Order #: [id number] Class: Print    Allergies Patient has no known allergies.  No family history on file.  Social History Social History  Substance Use Topics  . Smoking status: Never Smoker  . Smokeless tobacco: Never Used  . Alcohol use No     Comment: occ    Review of Systems Constitutional: No fever/chills.No lightheadedness or syncope. No generalized malaise, myalgia or fatigue. Eyes: No visual changes. No eye discharge. ENT: No sore throat. Positive congestion and rhinorrhea. No ear pain. Cardiovascular: Positive chest pain. Denies palpitations. Respiratory: Denies shortness of breath.  No cough. Gastrointestinal: As of epigastric abdominal pain.  Positive nausea, no vomiting.  Positive diarrhea.  No constipation. Genitourinary: Negative for dysuria. Positive increased vaginal discharge. Musculoskeletal: Negative for back pain. Skin: Negative for rash. Neurological: Negative for headaches. No focal numbness, tingling or weakness.   10-point ROS otherwise negative.  ____________________________________________   PHYSICAL EXAM:  VITAL SIGNS: ED Triage Vitals [01/17/16 0221]  Enc Vitals Group     BP 130/82     Pulse Rate 99     Resp 18     Temp      Temp src      SpO2 100 %     Weight 156 lb (70.8 kg)     Height 5\' 6"  (1.676 m)     Head Circumference      Peak Flow  Pain Score 5     Pain Loc      Pain Edu?      Excl. in GC?     Constitutional: Alert and oriented. Well appearing and in no acute distress. Answers questions appropriately.Moving about comfortably in the room and on the stretcher. Eyes: Conjunctivae are normal.  EOMI. No scleral icterus. No eye discharge. EARS: TMs are obscured by cerumen bilaterally.  Head: Atraumatic. Nose: Positive congestion with clear  rhinorrhea. Mouth/Throat: Mucous membranes are moist. No posterior pharyngeal erythema, tonsillar swelling or exudate. The posterior palate is symmetric and the uvula is midline. Cracked tooth with mild tenderness to palpation in the left lower jaw. Neck: No stridor.  Supple.  No JVD. No meningismus. Cardiovascular: Normal rate, regular rhythm. No murmurs, rubs or gallops.  Respiratory: Normal respiratory effort.  No accessory muscle use or retractions. Lungs CTAB.  No wheezes, rales or ronchi. Gastrointestinal: Soft, nontender and nondistended. No reproducible abdominal pain. No guarding or rebound.  No peritoneal signs. Musculoskeletal: No LE edema.  Neurologic:  A&Ox3.  Speech is clear.  Face and smile are symmetric.  EOMI.  Moves all extremities well. Skin:  Skin is warm, dry and intact. No rash noted. Psychiatric: Mood and affect are normal. Speech and behavior are normal.  Normal judgement.  ____________________________________________   LABS (all labs ordered are listed, but only abnormal results are displayed)  Labs Reviewed  BASIC METABOLIC PANEL - Abnormal; Notable for the following:       Result Value   Glucose, Bld 115 (*)    All other components within normal limits  CBC  TROPONIN I   ____________________________________________  EKG  ED ECG REPORT I, Rockne MenghiniNorman, Anne-Caroline, the attending physician, personally viewed and interpreted this ECG.   Date: 01/17/2016  EKG Time: 220  Rate: 81  Rhythm: normal sinus rhythm  Axis: normal  Intervals:none  ST&T Change: No ST elevation. No prolonged QTC. No evidence of Brugada syndrome. No evidence of hypertrophy.  ____________________________________________  RADIOLOGY  Dg Chest 2 View  Result Date: 01/17/2016 CLINICAL DATA:  Upper chest pain and abdominal pain. EXAM: CHEST  2 VIEW COMPARISON:  None. FINDINGS: The heart size and mediastinal contours are within normal limits. Both lungs are clear. The visualized skeletal  structures are unremarkable. IMPRESSION: No active cardiopulmonary disease. Electronically Signed   By: Ellery Plunkaniel R Mitchell M.D.   On: 01/17/2016 03:06    ____________________________________________   PROCEDURES  Procedure(s) performed: None  Procedures  Critical Care performed: No ____________________________________________   INITIAL IMPRESSION / ASSESSMENT AND PLAN / ED COURSE  Pertinent labs & imaging results that were available during my care of the patient were reviewed by me and considered in my medical decision making (see chart for details).  30 y.o. female presenting with a constellation of symptoms. Overall, the patient has stable vital signs and a reassuring examination. She does have evidence of a viral URI. It is possible that her nausea and diarrhea are related to her virus, or that this may be a side effect from her penicillin. I have encouraged her to restart her antibiotic, to take the amoxicillin she was prescribed, so that she may undergo procedures as necessary when she sees her dentist on Thursday. She has no evidence of abscess or significant complication to her cracked tooth. At this time, I do not see any acute cardiac abnormality. The patient has an EKG without ischemic changes, arrhythmia, prolonged QTC, Brugada syndrome or hypertrophy. Her chest x-ray does not show any acute  cardiopulmonary process. Her troponin is negative. It is possible that her symptoms are due to GERD or PUD, or sensitivity to her antibiotic course.. I will have her start with Tums, and follow-up with her primary care physician for further evaluation. I will treat her with Diflucan for likely vaginal candidiasis from the antibiotic, which she has had before. Overall, the patient will require follow-up with a primary care physician and with a dentist. She understands return precautions as well as follow-up instructions. At this time, the patient is safe for discharge from the emergency  department.  ____________________________________________  FINAL CLINICAL IMPRESSION(S) / ED DIAGNOSES  Final diagnoses:  Cracked tooth  Chest pain, unspecified type  Upper respiratory tract infection, unspecified type  Nausea without vomiting  Loose stools  Vaginal discharge    Clinical Course       NEW MEDICATIONS STARTED DURING THIS VISIT:  New Prescriptions   CALCIUM CARBONATE (TUMS) 500 MG CHEWABLE TABLET    Chew 1 tablet (200 mg of elemental calcium total) by mouth daily.   FLUCONAZOLE (DIFLUCAN) 150 MG TABLET    Take 1 tablet (150 mg total) by mouth once.   LOPERAMIDE (IMODIUM A-D) 2 MG TABLET    Take 1 tablet (2 mg total) by mouth 4 (four) times daily as needed for diarrhea or loose stools.   ONDANSETRON (ZOFRAN ODT) 4 MG DISINTEGRATING TABLET    Take 1 tablet (4 mg total) by mouth every 8 (eight) hours as needed for nausea or vomiting.      Rockne Menghini, MD 01/17/16 [phone]

## 2016-01-17 NOTE — ED Triage Notes (Signed)
Pt in via POV with complaints of intermittent chest pain since yesterday, states, "it feels like heartburn."  Pt also reports "messed up stomach."  States she was taking Penicillin due to tooth infection but has since quit taking it due to diarrhea.  Pt ambulatory to triage, no immediate distress noted at this time.

## 2016-01-17 NOTE — Discharge Instructions (Signed)
Please start the amoxicillin, and keep your appointment with your dentist on Thursday.  Please use Zofran for nausea, loperamide for diarrhea, and Tums for indigestion. Please follow the GERD specific diet to decrease your indigestion.  Please make an appointment with your primary care physician for follow-up on Wednesday.  Please return to the emergency department if you develop severe pain, inability to keep down fluids, fever, facial swelling, neck stiffness, severe pain, or any other symptoms concerning to you.

## 2016-05-05 ENCOUNTER — Encounter (INDEPENDENT_AMBULATORY_CARE_PROVIDER_SITE_OTHER): Payer: Self-pay

## 2016-05-05 ENCOUNTER — Ambulatory Visit: Payer: Self-pay | Attending: Oncology

## 2016-05-05 DIAGNOSIS — R87612 Low grade squamous intraepithelial lesion on cytologic smear of cervix (LGSIL): Secondary | ICD-10-CM

## 2016-05-10 ENCOUNTER — Ambulatory Visit: Payer: Self-pay

## 2016-05-10 NOTE — Progress Notes (Unsigned)
Patient referred [facility] with [facility] results.  [facility]  Reviewed pap results with patient, and explained need for GYN consult.

## 2016-05-26 ENCOUNTER — Other Ambulatory Visit: Payer: Self-pay | Admitting: Obstetrics and Gynecology

## 2016-05-26 ENCOUNTER — Encounter: Payer: Self-pay | Admitting: Obstetrics and Gynecology

## 2016-05-26 ENCOUNTER — Ambulatory Visit (INDEPENDENT_AMBULATORY_CARE_PROVIDER_SITE_OTHER): Payer: PRIVATE HEALTH INSURANCE | Admitting: Obstetrics and Gynecology

## 2016-05-26 VITALS — BP 106/69 | HR 81 | Ht 66.0 in | Wt 164.2 lb

## 2016-05-26 DIAGNOSIS — R87612 Low grade squamous intraepithelial lesion on cytologic smear of cervix (LGSIL): Secondary | ICD-10-CM

## 2016-05-26 NOTE — Progress Notes (Signed)
Referring Provider:  [facility]  HPI:  Kathleen Leach is a 31 y.o.  G2P1101  who presents today for evaluation and management of abnormal cervical cytology.    Dysplasia History:   LGSIL on Pap  ROS:  Pertinent items are noted in HPI.  OB History  Gravida Para Term Preterm AB Living  SAB TAB Ectopic Multiple Live Births          1    # Outcome Date GA Lbr Len/2nd Weight Sex Delivery Anes PTL Lv  2 Term 2013    F Vag-Spont  N LIV  1 Preterm 2012    F Vag-Spont   FD      Past Medical History:  Diagnosis Date  . Preeclampsia 2012  . Scoliosis     History reviewed. No pertinent surgical history.  SOCIAL HISTORY: History  Alcohol Use  . Yes    Comment: occ   History  Drug Use  . Types: Marijuana     Family History  Problem Relation Age of Onset  . Diabetes Mother   . Breast cancer Maternal Aunt   . Diabetes Maternal Aunt     ALLERGIES:  Patient has no known allergies.  She currently has no medications in their medication list.  Physical Exam: -Vitals:  BP 106/69   Pulse 81   Ht  (1.676 m)   Wt 164 lb 3 oz (74.5 kg)   BMI 26.50 kg/m  GEN: WD, WN, NAD.  A+ O x 3, good mood and affect. ABD:  NT, ND.  Soft, no masses.  No hernias noted.   Pelvic:   Vulva: Normal appearance.  No lesions.  Vagina: No lesions or abnormalities noted.  Support: Normal pelvic support.  Urethra No masses tenderness or scarring.  Meatus Normal size without lesions or prolapse.  Cervix: See below.  Anus: Normal exam.  No lesions.  Perineum: Normal exam.  No lesions.        Bimanual   Uterus: Normal size.  Non-tender.  Mobile.  AV.  Adnexae: No masses.  Non-tender to palpation.  Cul-de-sac: Negative for abnormality.   PROCEDURE: 1.  Urine Pregnancy Test:  not done 2.  Colposcopy performed with 4% acetic acid after verbal consent obtained                            -Aceto-white Lesions Location(s): 11 o'clock.              -Biopsy performed at 11 o'clock            -ECC indicated and performed: No.     -Biopsy sites made hemostatic with pressure and Monsel's solution   -Satisfactory colposcopy: Yes.      -Evidence of Invasive cervical CA :  NO  ASSESSMENT:  Kathleen Leach is a 31 y.o. G2P1101 here for  1. Low grade squamous intraepithelial lesion on cytologic smear of cervix (LGSIL)   .  PLAN: 1.  I discussed the grading system of pap smears and HPV high risk viral types.  We will discuss management after colpo results return.  No orders of the defined types were placed in this encounter.         F/U  Return in about 1 week (around 06/02/2016).  Brennan Bailey ,MD 05/26/2016,11:52 AM

## 2016-05-29 LAB — PATHOLOGY

## 2016-06-01 ENCOUNTER — Ambulatory Visit (INDEPENDENT_AMBULATORY_CARE_PROVIDER_SITE_OTHER): Payer: PRIVATE HEALTH INSURANCE | Admitting: Obstetrics and Gynecology

## 2016-06-01 ENCOUNTER — Encounter: Payer: Self-pay | Admitting: Obstetrics and Gynecology

## 2016-06-01 VITALS — BP 107/69 | HR 72 | Ht 66.0 in | Wt 163.1 lb

## 2016-06-01 DIAGNOSIS — R87612 Low grade squamous intraepithelial lesion on cytologic smear of cervix (LGSIL): Secondary | ICD-10-CM

## 2016-06-01 NOTE — Progress Notes (Signed)
HPI:      [name] is a 31 y.o. G2P1101 who LMP was No LMP recorded. Patient has had an injection.  Subjective:   She presents today For follow-up after her colposcopy. She had a Pap that showed LGSIL, but her colposcopy revealed no abnormalities. It was an adequate colposcopy. Of significant note she states that she previously had an abnormal Pap smear that then subsequently turned out to be normal. She is unsure whether or not she received Gardasil.    Hx: The following portions of the patient's history were reviewed and updated as appropriate:              She  has a past medical history of Preeclampsia (2012) and Scoliosis. She  does not have a problem list on file. She  has a past surgical history that includes Colposcopy. Her family history includes Breast cancer in her maternal aunt; Diabetes in her maternal aunt and mother. She  reports that she has never smoked. She has never used smokeless tobacco. She reports that she drinks alcohol. She reports that she uses drugs, including Marijuana. No current outpatient prescriptions on file prior to visit.   No current facility-administered medications on file prior to visit.          Review of Systems:  Review of Systems  Constitutional: Denied constitutional symptoms, night sweats, recent illness, fatigue, fever, insomnia and weight loss.  Eyes: Denied eye symptoms, eye pain, photophobia, vision change and visual disturbance.  Ears/Nose/Throat/Neck: Denied ear, nose, throat or neck symptoms, hearing loss, nasal discharge, sinus congestion and sore throat.  Cardiovascular: Denied cardiovascular symptoms, arrhythmia, chest pain/pressure, edema, exercise intolerance, orthopnea and palpitations.  Respiratory: Denied pulmonary symptoms, asthma, pleuritic pain, productive sputum, cough, dyspnea and wheezing.  Gastrointestinal: Denied, gastro-esophageal reflux, melena, nausea and vomiting.  Genitourinary: Denied genitourinary  symptoms including symptomatic vaginal discharge, pelvic relaxation issues, and urinary complaints.  Musculoskeletal: Denied musculoskeletal symptoms, stiffness, swelling, muscle weakness and myalgia.  Dermatologic: Denied dermatology symptoms, rash and scar.  Neurologic: Denied neurology symptoms, dizziness, headache, neck pain and syncope.  Psychiatric: Denied psychiatric symptoms, anxiety and depression.  Endocrine: Denied endocrine symptoms including hot flashes and night sweats.   Meds:   No current outpatient prescriptions on file prior to visit.   No current facility-administered medications on file prior to visit.     Objective:     Vitals:   06/01/16 1540  BP: 107/69  Pulse: 72              Colposcopy results reviewed with the patient.  Assessment:    G2P1101 There are no active problems to display for this patient.    1. Low grade squamous intraepithelial lesion on cytologic smear of cervix (LGSIL)     Normal findings at colposcopy.   Plan:            1.  I recommended a follow-up Pap smear in 6 months. I strongly recommend cytology in addition to high risk HPV testing no matter the findings at cytology. This will give [place] a much better idea if she has increased risk for dysplasia especially in light of abnormal Paps and normal colposcopies. I have explained this to her in detail. O      F/U  Return in about 6 months (around 12/01/2016). I spent 15 minutes with this patient of which greater than 50% was spent discussing abnormal colposcopies, abnormal Pap smears, HPV, recommended follow-up, Gardasil shots.  Elonda Husky, M.D. 06/01/2016  4:29 PM

## 2016-06-07 NOTE — Progress Notes (Addendum)
Patient had a benign cervical biopsy at [facility].  Recommendation is 6 month follow-up pap with cytology.  Will check with Dr. Logan Bores to see if he wants [facility] to perform that pap or if he needs to see her in clinic.  Copy to [facility].

## 2016-11-24 LAB — HM PAP SMEAR: HM Pap smear: POSITIVE

## 2016-12-01 ENCOUNTER — Encounter: Payer: PRIVATE HEALTH INSURANCE | Admitting: Obstetrics and Gynecology

## 2016-12-05 ENCOUNTER — Encounter: Payer: Self-pay | Admitting: Obstetrics and Gynecology

## 2016-12-27 ENCOUNTER — Encounter: Payer: Self-pay | Admitting: Obstetrics and Gynecology

## 2016-12-27 ENCOUNTER — Other Ambulatory Visit: Payer: Self-pay | Admitting: Obstetrics and Gynecology

## 2016-12-27 ENCOUNTER — Ambulatory Visit (INDEPENDENT_AMBULATORY_CARE_PROVIDER_SITE_OTHER): Payer: PRIVATE HEALTH INSURANCE | Admitting: Obstetrics and Gynecology

## 2016-12-27 VITALS — BP 99/66 | HR 74 | Ht 66.0 in | Wt 175.6 lb

## 2016-12-27 DIAGNOSIS — R87612 Low grade squamous intraepithelial lesion on cytologic smear of cervix (LGSIL): Secondary | ICD-10-CM | POA: Diagnosis not present

## 2016-12-27 NOTE — Progress Notes (Signed)
HPI:      [name] is a 31 y.o. G2P1101 who LMP was No LMP recorded. Patient has had an injection.  Subjective:   She presents today for follow-up Pap smear after having an LGSIL Pap followed by a completely normal colposcopy.    Hx: The following portions of the patient's history were reviewed and updated as appropriate:             She  has a past medical history of Preeclampsia (2012) and Scoliosis. She does not have a problem list on file. She  has a past surgical history that includes Colposcopy. Her family history includes Breast cancer in her maternal aunt; Diabetes in her maternal aunt and mother. She  reports that  has never smoked. she has never used smokeless tobacco. She reports that she drinks alcohol. She reports that she uses drugs. Drug: Marijuana. She has No Known Allergies.       Review of Systems:  Review of Systems  Constitutional: Denied constitutional symptoms, night sweats, recent illness, fatigue, fever, insomnia and weight loss.  Eyes: Denied eye symptoms, eye pain, photophobia, vision change and visual disturbance.  Ears/Nose/Throat/Neck: Denied ear, nose, throat or neck symptoms, hearing loss, nasal discharge, sinus congestion and sore throat.  Cardiovascular: Denied cardiovascular symptoms, arrhythmia, chest pain/pressure, edema, exercise intolerance, orthopnea and palpitations.  Respiratory: Denied pulmonary symptoms, asthma, pleuritic pain, productive sputum, cough, dyspnea and wheezing.  Gastrointestinal: Denied, gastro-esophageal reflux, melena, nausea and vomiting.  Genitourinary: Denied genitourinary symptoms including symptomatic vaginal discharge, pelvic relaxation issues, and urinary complaints.  Musculoskeletal: Denied musculoskeletal symptoms, stiffness, swelling, muscle weakness and myalgia.  Dermatologic: Denied dermatology symptoms, rash and scar.  Neurologic: Denied neurology symptoms, dizziness, headache, neck pain and syncope.   Psychiatric: Denied psychiatric symptoms, anxiety and depression.  Endocrine: Denied endocrine symptoms including hot flashes and night sweats.   Meds:   Current Outpatient Medications on File Prior to Visit  Medication Sig Dispense Refill  . medroxyPROGESTERone (DEPO-PROVERA) 150 MG/ML injection Inject 150 mg every 3 (three) months into the muscle.     No current facility-administered medications on file prior to visit.     Objective:     Vitals:   12/27/16 1356  BP: 99/66  Pulse: 74              Physical examination   Pelvic:   Vulva: Normal appearance.  No lesions.  Vagina: No lesions or abnormalities noted.  Support: Normal pelvic support.  Urethra No masses tenderness or scarring.  Meatus Normal size without lesions or prolapse.  Cervix: Normal appearance.  No lesions.  Anus: Normal exam.  No lesions.  Perineum: Normal exam.  No lesions.        Bimanual   Uterus: Normal size.  Non-tender.  Mobile.  AV.  Adnexae: No masses.  Non-tender to palpation.  Cul-de-sac: Negative for abnormality.     Assessment:    G2P1101 There are no active problems to display for this patient.    1. Low grade squamous intraepithelial lesion on cytologic smear of cervix (LGSIL)     ###Normal colposcopy###   Plan:            1.  Cytology Pap  2.  High risk HPV testing regardless of Pap findings.  3.  I will base future management on above findings. Orders No orders of the defined types were placed in this encounter.   No orders of the defined types were placed in this encounter.  F/U  Return for We will contact her with any abnormal test results. I spent 15 minutes with this patient of which greater than 50% was spent discussing abnormal Pap smears, colposcopies, HPV testing, possible future follow-up methods and timing.  All patient's questions were answered.  Elonda Huskyavid J. Evans, M.D. 12/27/2016 2:46 PM

## 2017-01-01 LAB — GYN REPORT: PAP SMEAR COMMENT: 0

## 2017-01-01 LAB — SPECIMEN STATUS REPORT

## 2017-01-08 ENCOUNTER — Telehealth: Payer: Self-pay | Admitting: Obstetrics and Gynecology

## 2017-01-08 ENCOUNTER — Encounter: Payer: Self-pay | Admitting: Obstetrics and Gynecology

## 2017-01-08 NOTE — Telephone Encounter (Signed)
Pt stated she logged on MyChart to get her PAP results from 12/27/16 and there was a message stating that labs were done b/c name on sample didn't match name on lab order. Pt would like a call back to see what this means and if she needs to have the test redone. Please advise. Thanks TNP

## 2017-01-08 NOTE — Telephone Encounter (Signed)
Spoke with pt- [facility] mixed up the specimens and paperwork. A pap vial was sent back to us with the correct order (our pt) but not the correct pap vial. It belonged to a pt that is not an [facility] pt. [place] lab tech, spoke with [facility] and got the correct specimen tested.I will contact pt with her results as soon as we receive them.

## 2017-01-12 LAB — HPV, LOW VOLUME (REFLEX): HPV, LOW VOL REFLEX: POSITIVE — AB

## 2017-01-12 LAB — PAP LB AND HPV HIGH-RISK: PAP SMEAR COMMENT: 0

## 2017-01-15 ENCOUNTER — Telehealth: Payer: Self-pay

## 2017-01-15 ENCOUNTER — Encounter: Payer: Self-pay | Admitting: Obstetrics and Gynecology

## 2017-01-15 NOTE — Telephone Encounter (Signed)
Mychart message sent and info mailed.

## 2017-02-15 ENCOUNTER — Telehealth: Payer: Self-pay

## 2017-02-15 NOTE — Telephone Encounter (Signed)
mychart message sent

## 2017-02-15 NOTE — Telephone Encounter (Signed)
Voicemail message left to change appt from 2PM to 10AM. Requested a call back to confirm.

## 2017-02-16 ENCOUNTER — Encounter: Payer: PRIVATE HEALTH INSURANCE | Admitting: Obstetrics and Gynecology

## 2017-03-02 ENCOUNTER — Encounter: Payer: PRIVATE HEALTH INSURANCE | Admitting: Obstetrics and Gynecology

## 2017-04-13 ENCOUNTER — Encounter: Payer: PRIVATE HEALTH INSURANCE | Admitting: Obstetrics and Gynecology

## 2017-05-04 DIAGNOSIS — E663 Overweight: Secondary | ICD-10-CM | POA: Insufficient documentation

## 2017-05-04 LAB — HM HIV SCREENING LAB: HM HIV Screening: NEGATIVE

## 2017-05-21 ENCOUNTER — Ambulatory Visit: Payer: Self-pay

## 2017-06-25 ENCOUNTER — Ambulatory Visit: Payer: Self-pay | Attending: Oncology

## 2017-06-25 VITALS — Ht 68.0 in | Wt 168.0 lb

## 2017-06-25 DIAGNOSIS — R87612 Low grade squamous intraepithelial lesion on cytologic smear of cervix (LGSIL): Secondary | ICD-10-CM

## 2017-06-25 NOTE — Progress Notes (Signed)
Patient referred to [facility] to schedule appointment with Dr Logan Bores for follow-up pap, and possible colposcopy with biopsy.  Last pap result from 12/27/16 was LGSIL/HPV positive.  Patient saw Dr. Logan Bores in December of 2018, but subsequent  follow-up appointments appear to have been cancelled.  Patient has many questions about HPV.  Encouraged her to present these questions at GYN visit.  Joellyn Quails to schedule [facility] Appointment.

## 2017-07-13 ENCOUNTER — Encounter: Payer: PRIVATE HEALTH INSURANCE | Admitting: Obstetrics and Gynecology

## 2017-08-14 ENCOUNTER — Ambulatory Visit (INDEPENDENT_AMBULATORY_CARE_PROVIDER_SITE_OTHER): Payer: PRIVATE HEALTH INSURANCE | Admitting: Obstetrics and Gynecology

## 2017-08-14 ENCOUNTER — Other Ambulatory Visit (HOSPITAL_COMMUNITY)
Admission: RE | Admit: 2017-08-14 | Discharge: 2017-08-14 | Disposition: A | Discharge status: Home / Self Care | Payer: Self-pay | Source: Ambulatory Visit | Attending: Obstetrics and Gynecology | Admitting: Obstetrics and Gynecology

## 2017-08-14 ENCOUNTER — Encounter: Payer: Self-pay | Admitting: Obstetrics and Gynecology

## 2017-08-14 VITALS — BP 110/73 | HR 90 | Ht 68.0 in | Wt 178.0 lb

## 2017-08-14 DIAGNOSIS — R87612 Low grade squamous intraepithelial lesion on cytologic smear of cervix (LGSIL): Secondary | ICD-10-CM | POA: Insufficient documentation

## 2017-08-14 NOTE — Progress Notes (Signed)
Pt stated that she is doing well no concerns.  

## 2017-08-14 NOTE — Progress Notes (Signed)
HPI:  Kathleen Leach is a 32 y.o.  [phone]  who presents today for evaluation and management of abnormal cervical cytology.    Dysplasia History: History of positive HPV and LGSIL Pap.  Colposcopy 1 year ago revealed negative cytology.  ROS:  Pertinent items are noted in HPI.  OB History  Gravida Para Term Preterm AB Living  2 2 1 1   1   SAB TAB Ectopic Multiple Live Births          1    # Outcome Date GA Lbr Len/2nd Weight Sex Delivery Anes PTL Lv  2 Term 2013    F Vag-Spont  N LIV  1 Preterm 2012    F Vag-Spont   FD    Past Medical History:  Diagnosis Date  . HPV in female   . Preeclampsia 2012  . Scoliosis     Past Surgical History:  Procedure Laterality Date  . COLPOSCOPY      SOCIAL HISTORY: Social History   Substance and Sexual Activity  Alcohol Use Yes   Comment: occ   Social History   Substance and Sexual Activity  Drug Use Yes  . Types: Marijuana     Family History  Problem Relation Age of Onset  . Diabetes Mother   . Breast cancer Maternal Aunt   . Diabetes Maternal Aunt     ALLERGIES:  Patient has no known allergies.  She has a current medication list which includes the following prescription(s): medroxyprogesterone.  Physical Exam: -Vitals:  BP 110/73   Pulse 90   Ht 5\' 8"  (1.727 m)   Wt 178 lb (80.7 kg)   BMI 27.06 kg/m  GEN: WD, WN, NAD.  A+ O x 3, good mood and affect. ABD:  NT, ND.  Soft, no masses.  No hernias noted.   Pelvic:   Vulva: Normal appearance.  No lesions.  Vagina: No lesions or abnormalities noted.  Support: Normal pelvic support.  Urethra No masses tenderness or scarring.  Meatus Normal size without lesions or prolapse.  Cervix: See below.  Anus: Normal exam.  No lesions.  Perineum: Normal exam.  No lesions.        Bimanual   Uterus: Normal size.  Non-tender.  Mobile.  AV.  Adnexae: No masses.  Non-tender to palpation.  Cul-de-sac: Negative for abnormality.   PROCEDURE: 1.  Urine Pregnancy Test:   negative 2.  Colposcopy performed with 4% acetic acid after verbal consent obtained                           -Aceto-white Lesions Location(s): 4 and 10 o'clock.              -Biopsy performed at 4  and 10 o'clock               -ECC indicated and performed: No.     -Biopsy sites made hemostatic with pressure and Monsel's solution   -Satisfactory colposcopy: Yes.      -Evidence of Invasive cervical CA :  NO  ASSESSMENT:  Kathleen Leach is a 32 y.o. G2P1101 here for  1. Low grade squamous intraepithelial lesion on cytologic smear of cervix (LGSIL)   .  PLAN: 1.  I discussed the grading system of pap smears and HPV high risk viral types.  We will discuss management after colpo results return.  No orders of the defined types were placed in this encounter.  F/U  Return in about 1 week (around 08/21/2017). I spent 22 minutes involved in the care of this patient of which greater than 50% was spent discussing HPV, risk of transmission, future partners, risk of cancer, high risk versus low risk types of HPV, type specific #16 patient had many many questions regarding vaccines.  And virus transmission and ability to resolve cervical problems etc.  Brennan Baileyavid Lesle Faron ,MD 08/14/2017,2:53 PM

## 2017-08-23 ENCOUNTER — Ambulatory Visit (INDEPENDENT_AMBULATORY_CARE_PROVIDER_SITE_OTHER): Payer: PRIVATE HEALTH INSURANCE | Admitting: Obstetrics and Gynecology

## 2017-08-23 ENCOUNTER — Encounter: Payer: Self-pay | Admitting: Obstetrics and Gynecology

## 2017-08-23 VITALS — BP 100/66 | HR 68 | Ht 68.0 in | Wt 182.3 lb

## 2017-08-23 DIAGNOSIS — R87612 Low grade squamous intraepithelial lesion on cytologic smear of cervix (LGSIL): Secondary | ICD-10-CM

## 2017-08-23 NOTE — Progress Notes (Signed)
Pt stated that she is doing well.  

## 2017-08-23 NOTE — Progress Notes (Signed)
HPI:      [name] is a 32 y.o. G2P1101 who LMP was No LMP recorded. Patient has had an injection.  Subjective:   She presents today for her colposcopy results.    Hx: The following portions of the patient's history were reviewed and updated as appropriate:             She  has a past medical history of HPV in female, Preeclampsia (2012), and Scoliosis. She does not have a problem list on file. She  has a past surgical history that includes Colposcopy. Her family history includes Breast cancer in her maternal aunt; Diabetes in her maternal aunt and mother. She  reports that she has never smoked. She has never used smokeless tobacco. She reports that she drinks alcohol. She reports that she has current or past drug history. Drug: Marijuana. She has a current medication list which includes the following prescription(s): medroxyprogesterone. She has No Known Allergies.       Review of Systems:  Review of Systems  Constitutional: Denied constitutional symptoms, night sweats, recent illness, fatigue, fever, insomnia and weight loss.  Eyes: Denied eye symptoms, eye pain, photophobia, vision change and visual disturbance.  Ears/Nose/Throat/Neck: Denied ear, nose, throat or neck symptoms, hearing loss, nasal discharge, sinus congestion and sore throat.  Cardiovascular: Denied cardiovascular symptoms, arrhythmia, chest pain/pressure, edema, exercise intolerance, orthopnea and palpitations.  Respiratory: Denied pulmonary symptoms, asthma, pleuritic pain, productive sputum, cough, dyspnea and wheezing.  Gastrointestinal: Denied, gastro-esophageal reflux, melena, nausea and vomiting.  Genitourinary: Denied genitourinary symptoms including symptomatic vaginal discharge, pelvic relaxation issues, and urinary complaints.  Musculoskeletal: Denied musculoskeletal symptoms, stiffness, swelling, muscle weakness and myalgia.  Dermatologic: Denied dermatology symptoms, rash and scar.  Neurologic:  Denied neurology symptoms, dizziness, headache, neck pain and syncope.  Psychiatric: Denied psychiatric symptoms, anxiety and depression.  Endocrine: Denied endocrine symptoms including hot flashes and night sweats.   Meds:   Current Outpatient Medications on File Prior to Visit  Medication Sig Dispense Refill  . medroxyPROGESTERone (DEPO-PROVERA) 150 MG/ML injection Inject 150 mg every 3 (three) months into the muscle.     No current facility-administered medications on file prior to visit.     Objective:     Vitals:   08/23/17 0821  BP: 100/66  Pulse: 68              Colpo biopsies are negative for dysplasia.  Assessment:    G2P1101 There are no active problems to display for this patient.    1. Low grade squamous intraepithelial lesion on cytologic smear of cervix (LGSIL)     Negative colposcopy.  This is her second negative cytology colposcopy.  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   Plan:            1.  I think her risk is very low for significant dysplasia.  I have recommended follow-up Pap smear in 1 year and decide management based on that Pap. Orders No orders of the defined types were placed in this encounter.   No orders of the defined types were placed in this encounter.     F/U  Return in about 1 year (around 08/24/2018). I spent 16 minutes involved in the care of this patient of which greater than 50% was spent discussing her HPV, cervical dysplasia, the difference between abnormal Pap smears and colposcopies.  Patient had numerous questions including transmission of HPV by males, identification of different forms of the virus, viral dormancy, cytologic changes etc.  all questions answered.  Elonda Huskyavid J. Tanecia Mccay, M.D. 08/23/2017 8:46 AM

## 2017-11-23 NOTE — Progress Notes (Addendum)
Patient had benign cervical biopsy results with Dr. Logan Bores at [facility].  Per Dr. Logan Bores, patient is to have follow-up in office in 1 year.  Will notify office that patient will need to be screened for  [facility] eligibility prior to office visit. Left patient a message to call to schedule appointment with Joellyn Quails.  Copy to [facility].

## 2018-06-15 IMAGING — CR DG CHEST 2V
1 series · 2 of 2 positions shown · non-contrast
Comparison: None.

CLINICAL DATA: Upper chest pain and abdominal pain.

EXAM:
CHEST  2 VIEW

[Series 1: dg chest 2 view · 0.14mm/px · 2 of 2 slices shown]
[im 1/2]
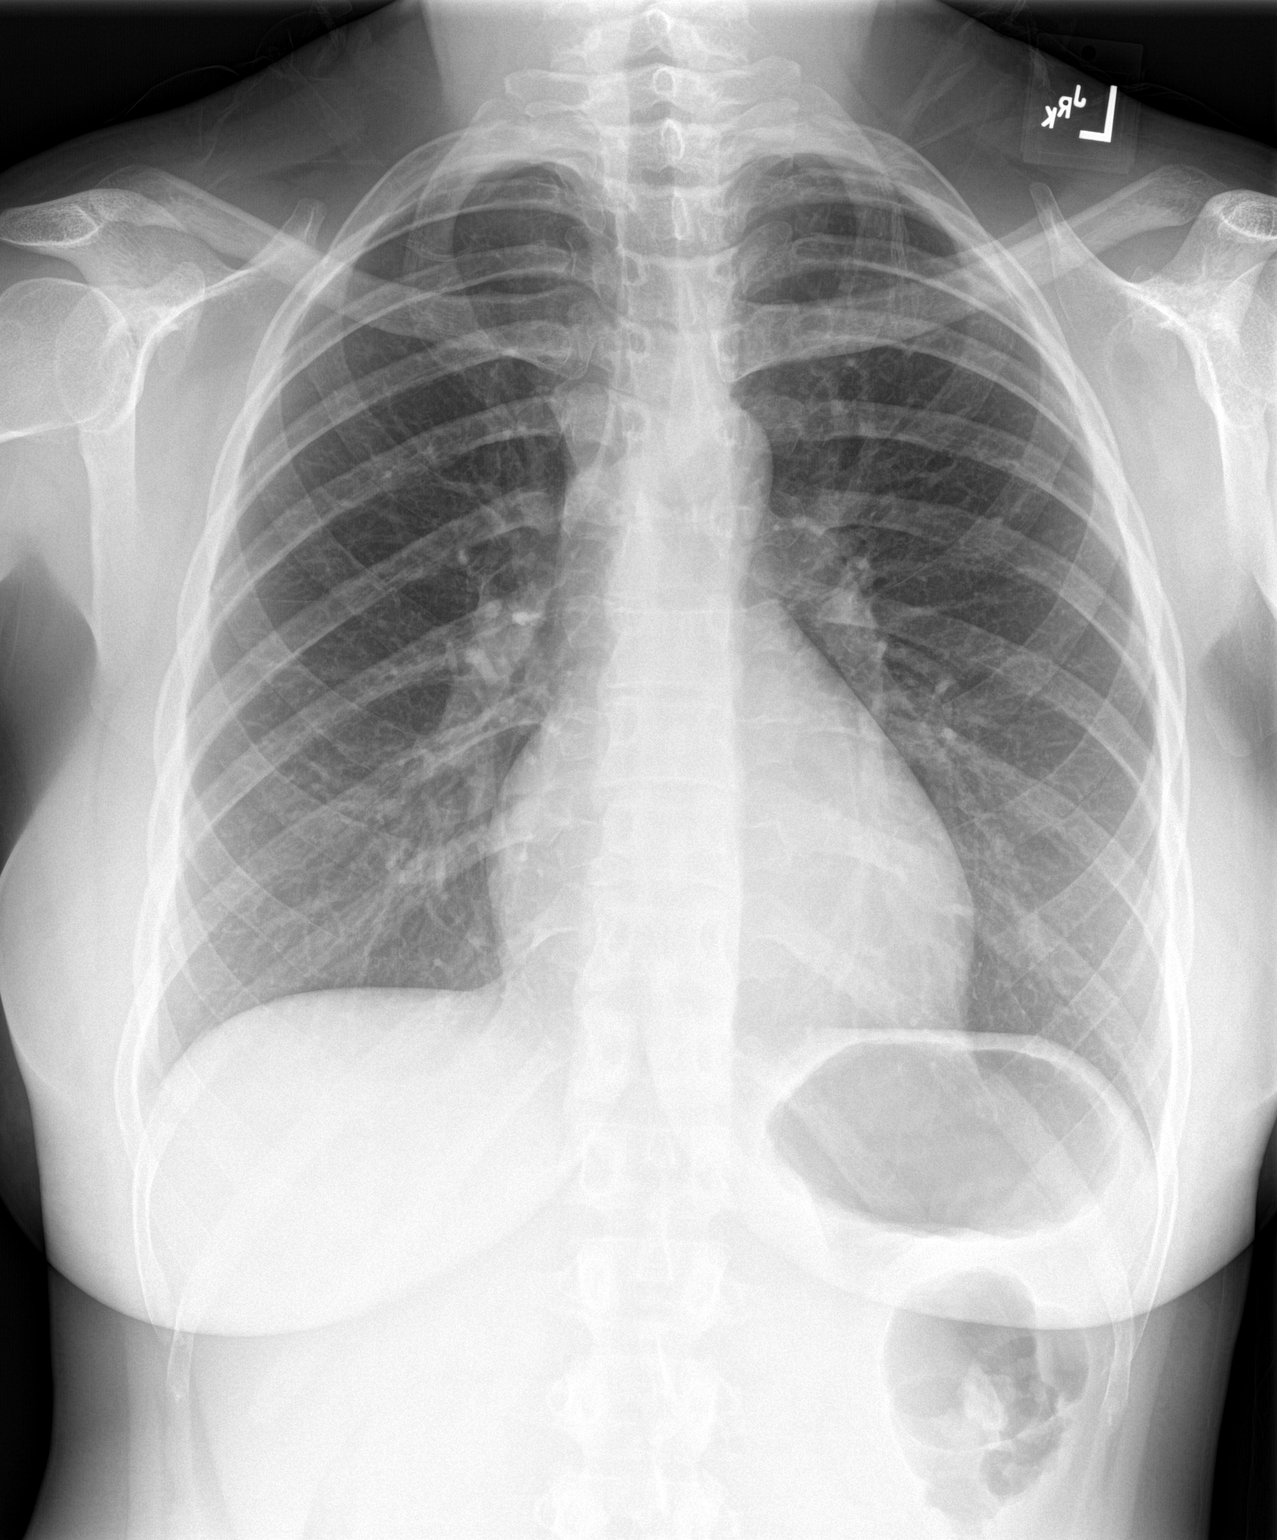
[im 2/2]
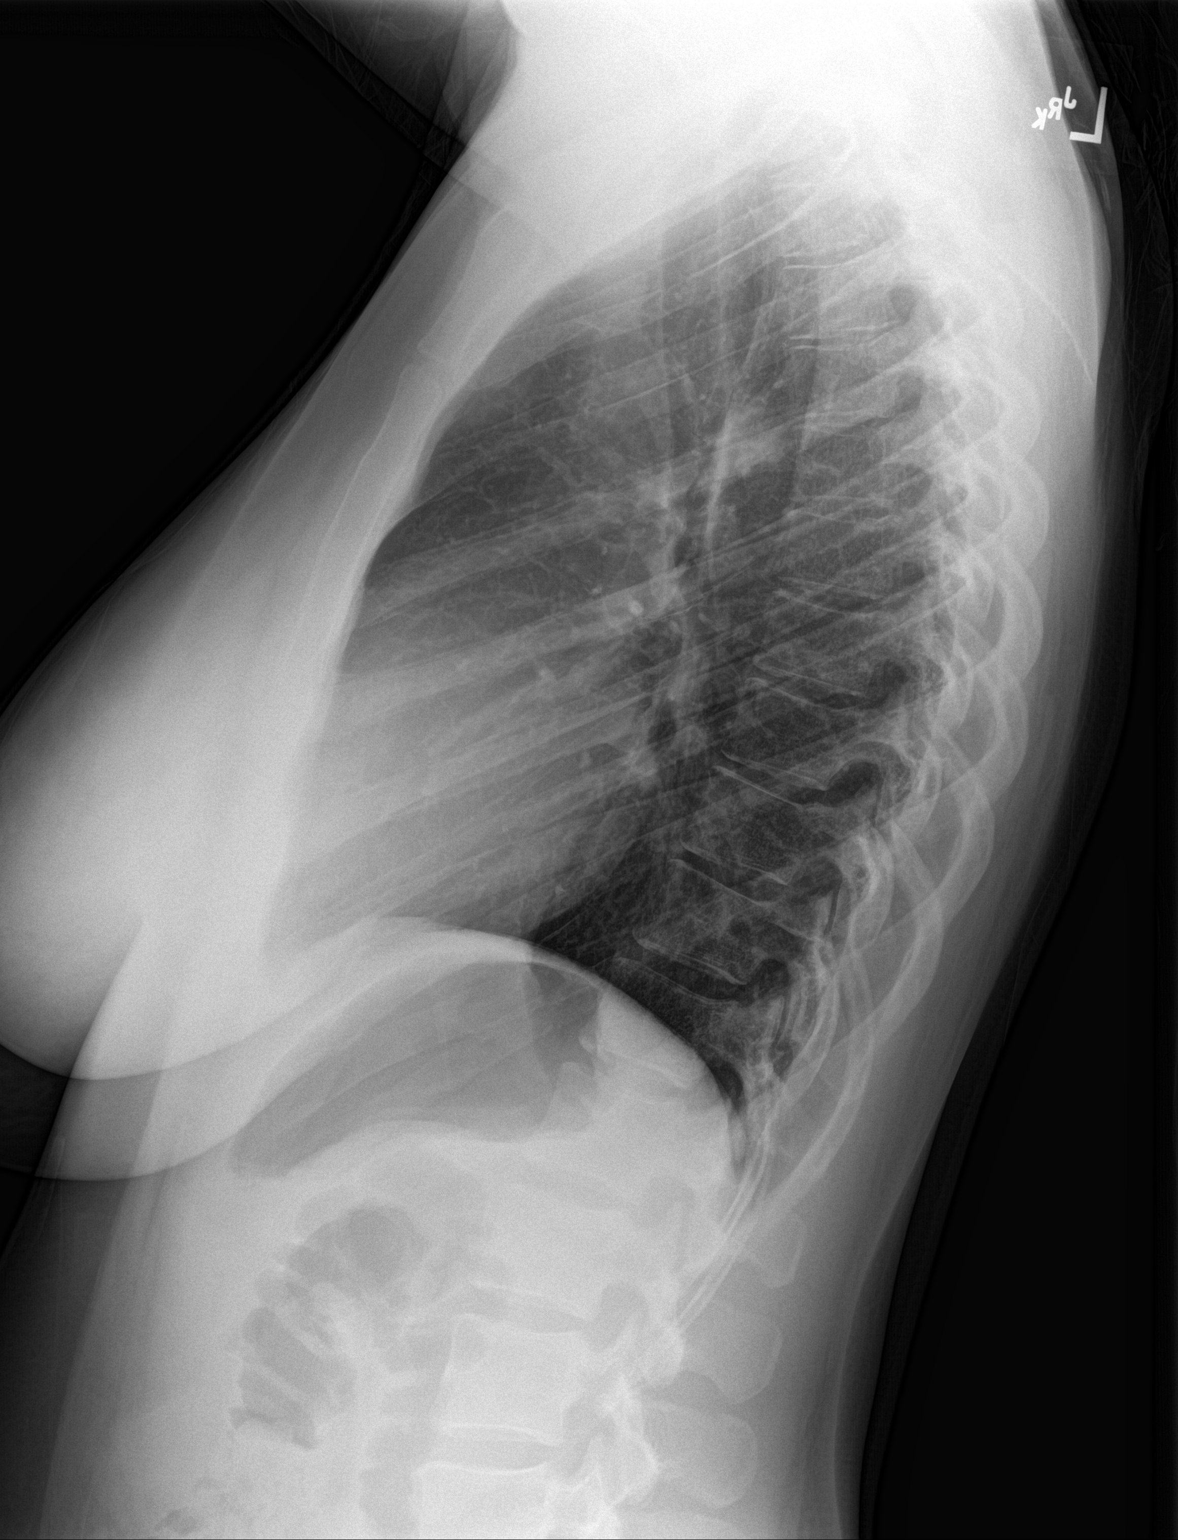

[2 of 2 positions shown; findings below may reference images not displayed]

FINDINGS: The heart size and mediastinal contours are within normal limits.
Both lungs are clear. The visualized skeletal structures are
unremarkable.
IMPRESSION: No active cardiopulmonary disease.

## 2018-08-27 ENCOUNTER — Encounter: Payer: PRIVATE HEALTH INSURANCE | Admitting: Obstetrics and Gynecology

## 2018-09-02 ENCOUNTER — Other Ambulatory Visit: Payer: Self-pay

## 2018-09-02 DIAGNOSIS — E663 Overweight: Secondary | ICD-10-CM

## 2018-09-03 ENCOUNTER — Encounter: Payer: Self-pay | Admitting: Obstetrics and Gynecology

## 2018-09-03 ENCOUNTER — Ambulatory Visit: Payer: BC Managed Care – PPO | Attending: Oncology

## 2018-09-03 ENCOUNTER — Encounter: Payer: Self-pay | Admitting: *Deleted

## 2018-09-03 ENCOUNTER — Other Ambulatory Visit (HOSPITAL_COMMUNITY)
Admission: RE | Admit: 2018-09-03 | Discharge: 2018-09-03 | Disposition: A | Discharge status: Home / Self Care | Payer: BC Managed Care – PPO | Source: Ambulatory Visit | Attending: Obstetrics and Gynecology | Admitting: Obstetrics and Gynecology

## 2018-09-03 ENCOUNTER — Ambulatory Visit (INDEPENDENT_AMBULATORY_CARE_PROVIDER_SITE_OTHER): Payer: BC Managed Care – PPO | Admitting: Obstetrics and Gynecology

## 2018-09-03 ENCOUNTER — Other Ambulatory Visit: Payer: Self-pay

## 2018-09-03 ENCOUNTER — Ambulatory Visit (LOCAL_COMMUNITY_HEALTH_CENTER): Payer: BC Managed Care – PPO

## 2018-09-03 VITALS — BP 106/69 | HR 80 | Ht 66.0 in | Wt 185.9 lb

## 2018-09-03 VITALS — BP 108/73 | Ht 66.0 in | Wt 185.0 lb

## 2018-09-03 DIAGNOSIS — Z3009 Encounter for other general counseling and advice on contraception: Secondary | ICD-10-CM | POA: Diagnosis not present

## 2018-09-03 DIAGNOSIS — Z01419 Encounter for gynecological examination (general) (routine) without abnormal findings: Secondary | ICD-10-CM

## 2018-09-03 DIAGNOSIS — B977 Papillomavirus as the cause of diseases classified elsewhere: Secondary | ICD-10-CM | POA: Diagnosis present

## 2018-09-03 DIAGNOSIS — N72 Inflammatory disease of cervix uteri: Secondary | ICD-10-CM | POA: Insufficient documentation

## 2018-09-03 DIAGNOSIS — Z30013 Encounter for initial prescription of injectable contraceptive: Secondary | ICD-10-CM | POA: Diagnosis not present

## 2018-09-03 MED ORDER — MEDROXYPROGESTERONE ACETATE 150 MG/ML IM SUSP
150.0000 mg | Freq: Once | INTRAMUSCULAR | Status: AC
  Administered 2018-09-03: 150 mg via INTRAMUSCULAR

## 2018-09-03 MED ORDER — MULTI-VITAMIN/MINERALS PO TABS: 1.0000 | ORAL_TABLET | Freq: Every day | ORAL | 0 refills | Status: DC

## 2018-09-03 NOTE — Progress Notes (Signed)
Patient presents to [facility] today to renew paperwork for continuation of care of abnormal pap with Dr. Amalia Hailey.  Patient now has [employer], but still meets financial eligibility.  She is to call if insurance does not pay for her colposcopy and biopsy.

## 2018-09-03 NOTE — Progress Notes (Signed)
Patient comes in today for her physical. Patient is having discomfort during intercourse. Abnormal PAP in the past.

## 2018-09-03 NOTE — Progress Notes (Signed)
HPI:      [name] is a 33 y.o. G2P1101 who LMP was No LMP recorded. Patient has had an injection.  Subjective:   She presents today for her annual examination.  She has no complaints.  She uses Kathleen Leach for birth control.  Patient had an abnormal Pap smear last year but a normal colposcopy.  She is known to have positive HPV.    Hx: The following portions of the patient's history were reviewed and updated as appropriate:             She  has a past medical history of Gestational diabetes, HELLP syndrome, HPV in female, Preeclampsia (2012), and Scoliosis. She does not have any pertinent problems on file. She  has a past surgical history that includes Colposcopy. Her family history includes Breast cancer in her maternal aunt; Diabetes in her father, maternal aunt, maternal grandfather, maternal grandmother, mother, and paternal grandmother; Hypertension in her father, maternal grandfather, maternal grandmother, and mother. She  reports that she has never smoked. She has never used smokeless tobacco. She reports current alcohol use. She reports current drug use. Drug: Marijuana. She has a current medication list which includes the following prescription(s): medroxyprogesterone. She has No Known Allergies.       Review of Systems:  Review of Systems  Constitutional: Denied constitutional symptoms, night sweats, recent illness, fatigue, fever, insomnia and weight loss.  Eyes: Denied eye symptoms, eye pain, photophobia, vision change and visual disturbance.  Ears/Nose/Throat/Neck: Denied ear, nose, throat or neck symptoms, hearing loss, nasal discharge, sinus congestion and sore throat.  Cardiovascular: Denied cardiovascular symptoms, arrhythmia, chest pain/pressure, edema, exercise intolerance, orthopnea and palpitations.  Respiratory: Denied pulmonary symptoms, asthma, pleuritic pain, productive sputum, cough, dyspnea and wheezing.  Gastrointestinal: Denied, gastro-esophageal reflux,  melena, nausea and vomiting.  Genitourinary: Denied genitourinary symptoms including symptomatic vaginal discharge, pelvic relaxation issues, and urinary complaints.  Musculoskeletal: Denied musculoskeletal symptoms, stiffness, swelling, muscle weakness and myalgia.  Dermatologic: Denied dermatology symptoms, rash and scar.  Neurologic: Denied neurology symptoms, dizziness, headache, neck pain and syncope.  Psychiatric: Denied psychiatric symptoms, anxiety and depression.  Endocrine: Denied endocrine symptoms including hot flashes and night sweats.   Meds:   Current Outpatient Medications on File Prior to Visit  Medication Sig Dispense Refill  . medroxyPROGESTERone (Kathleen Leach-PROVERA) 150 MG/ML injection Inject 150 mg every 3 (three) months into the muscle.     No current facility-administered medications on file prior to visit.     Objective:     Vitals:   09/03/18 0946  BP: 106/69  Pulse: 80              Physical examination General NAD, Conversant  HEENT Atraumatic; Op clear with mmm.  Normo-cephalic. Pupils reactive. Anicteric sclerae  Thyroid/Neck Smooth without nodularity or enlargement. Normal ROM.  Neck Supple.  Skin No rashes, lesions or ulceration. Normal palpated skin turgor. No nodularity.  Breasts: No masses or discharge.  Symmetric.  No axillary adenopathy.  Lungs: Clear to auscultation.No rales or wheezes. Normal Respiratory effort, no retractions.  Heart: NSR.  No murmurs or rubs appreciated. No periferal edema  Abdomen: Soft.  Non-tender.  No masses.  No HSM. No hernia  Extremities: Moves all appropriately.  Normal ROM for age. No lymphadenopathy.  Neuro: Oriented to PPT.  Normal mood. Normal affect.     Pelvic:   Vulva: Normal appearance.  No lesions.  Vagina: No lesions or abnormalities noted.  Support: Normal pelvic support.  Urethra No masses  tenderness or scarring.  Meatus Normal size without lesions or prolapse.  Cervix: Normal appearance.  No lesions.   Anus: Normal exam.  No lesions.  Perineum: Normal exam.  No lesions.        Bimanual   Uterus: Normal size.  Non-tender.  Mobile.  AV.  Adnexae: No masses.  Non-tender to palpation.  Cul-de-sac: Negative for abnormality.      Assessment:    G2P1101 Patient Active Problem List   Diagnosis Date Noted  . Overweight 05/04/2017     1. Well woman exam with routine gynecological exam   2. High risk human papilloma virus (HPV) infection of cervix     Normal colposcopy 1 year ago   Plan:            1.  Basic Screening Recommendations The basic screening recommendations for asymptomatic women were discussed with the patient during her visit.  The age-appropriate recommendations were discussed with her and the rational for the tests reviewed.  When I am informed by the patient that another primary care physician has previously obtained the age-appropriate tests and they are up-to-date, only outstanding tests are ordered and referrals given as necessary.  Abnormal results of tests will be discussed with her when all of her results are completed. Pap performed  We will base future colposcopies on this Pap smear. 2.  Discussed multiple other methods of birth control as patient has been on Kathleen Leach " for 1 to 2 years".  Patient will consider other options and inform us when she has decided   Orders No orders of the defined types were placed in this encounter.   No orders of the defined types were placed in this encounter.       F/U  Return for We will contact her with any abnormal test results.  Finis Bud, M.D. 09/03/2018 10:21 AM

## 2018-09-03 NOTE — Progress Notes (Signed)
Per client, had physical today at [facility]. Consult with Hassell Done FNP as no current Depo order. Per MS. Marzetta Board, may administer Depo 150mg  IM today. Client tolerated injection without difficulty. Folic acid counseling completed. Reminder card given with next Depo due date. Shona Needles, RN

## 2018-09-03 NOTE — Addendum Note (Signed)
Addended by: Durwin Glaze on: 09/03/2018 10:39 AM   Modules accepted: Orders

## 2018-09-05 LAB — CYTOLOGY - PAP
Diagnosis: NEGATIVE
HPV: NOT DETECTED

## 2018-12-18 ENCOUNTER — Other Ambulatory Visit: Payer: Self-pay

## 2018-12-18 ENCOUNTER — Ambulatory Visit: Payer: Self-pay

## 2018-12-18 DIAGNOSIS — Z23 Encounter for immunization: Secondary | ICD-10-CM

## 2018-12-19 ENCOUNTER — Other Ambulatory Visit: Payer: Self-pay

## 2018-12-19 ENCOUNTER — Ambulatory Visit: Payer: Self-pay

## 2018-12-19 DIAGNOSIS — Z0000 Encounter for general adult medical examination without abnormal findings: Secondary | ICD-10-CM

## 2019-01-16 ENCOUNTER — Other Ambulatory Visit: Payer: Self-pay

## 2019-01-16 ENCOUNTER — Ambulatory Visit (LOCAL_COMMUNITY_HEALTH_CENTER): Payer: Self-pay | Admitting: Family Medicine

## 2019-01-16 VITALS — Ht 66.0 in | Wt 190.2 lb

## 2019-01-16 DIAGNOSIS — Z8632 Personal history of gestational diabetes: Secondary | ICD-10-CM | POA: Insufficient documentation

## 2019-01-16 DIAGNOSIS — Z3042 Encounter for surveillance of injectable contraceptive: Secondary | ICD-10-CM

## 2019-01-16 DIAGNOSIS — Z30013 Encounter for initial prescription of injectable contraceptive: Secondary | ICD-10-CM

## 2019-01-16 DIAGNOSIS — Z8759 Personal history of other complications of pregnancy, childbirth and the puerperium: Secondary | ICD-10-CM | POA: Insufficient documentation

## 2019-01-16 MED ORDER — MEDROXYPROGESTERONE ACETATE 150 MG/ML IM SUSP
150.0000 mg | INTRAMUSCULAR | Status: AC
  Administered 2019-01-16: 150 mg via INTRAMUSCULAR

## 2019-01-16 NOTE — Progress Notes (Signed)
Family Planning Visit- Repeat Yearly Visit  Subjective:  Kathleen Leach is a 33 y.o. being seen today for an well woman visit and to discuss family planning options.    She is currently using Depo-Provera injections for pregnancy prevention. Patient reports she does not if she or her partner wants a pregnancy in the next year. Patient  has Overweight; History of gestational diabetes; and History of gestational hypertension on their problem list.  Chief Complaint  Patient presents with  . Contraception    desires depo (in arm)    Patient reports She would like to restart Depo. She is 19 weeks since last injection. LMP was several months ago, last sex 4 days ago. Is not interested in [facility] today.   Does the patient desire a pregnancy in the next year? ([facility] flowsheet) Probably not  See flowsheet for other program required questions.   Body mass index is 30.7 kg/m. - Patient is eligible for diabetes screening based on BMI and age >57?  no HA1C ordered? not applicable  Patient reports 1 of partners in last year. Desires STI screening?  No - 1 partner, declines  Does the patient have a current or past history of drug use? No   No components found for: HCV]   There are no preventive care reminders to display for this patient.  ROS  The following portions of the patient's history were reviewed and updated as appropriate: allergies, current medications, past family history, past medical history, past social history, past surgical history and problem list. Problem list updated.  Objective:   Vitals:   01/16/19 1555  Weight: 190 lb 3.2 oz (86.3 kg)  Height: 5\' 6"  (1.676 m)    Physical Exam    Assessment and Plan:  Kathleen Leach is a 33 y.o. female presenting to the [facility] for an initial well woman exam/family planning visit  Contraception counseling: Reviewed all forms of birth control options in the tiered based approach. available including abstinence;  over the counter/barrier methods; hormonal contraceptive medication including pill, patch, ring, injection,contraceptive implant; hormonal and nonhormonal IUDs; permanent sterilization options including vasectomy and the various tubal sterilization modalities. Risks, benefits, and typical effectiveness rates were reviewed.  Questions were answered.  Written information was also given to the patient to review.  Patient desires Depo again, this was prescribed for patient. She will follow up in  3 months for surveillance.  She was told to call with any further questions, or with any concerns about this method of contraception.  Emphasized use of condoms 100% of the time for STI prevention.  Patient was offered ECP. ECP was not accepted by the patient. Therefore ECP counseling was not given - see RN documentation    1. Encounter for surveillance of injectable contraceptive -We discussed that since pt has had unprotected sex 4 days ago, LMP 3 months ago she is at risk of pregnancy. Discussed risks and benefits of following options: depo today + home preg test in 10 days or wait 10 days, RTC for preg test + depo. She verbalizes understanding and prefers to get depo today, take home preg test in 10 days.  -Risks of Depo use >2 yrs discussed with pt. Alternative BCM discussed, pt would like to continue Depo. Suggested vitamin D and weight bearing exercise and avoiding cigarette smoking and excessive alcohol consumption to promote bone health.  -As pt is unsure of whether she wants a pregnancy soon we discussed that it can take up to  18 months for fertility to return after Depo, she states understanding. -Follow up in 3 months for repeat Depo Provera injection.  -Pt to use backup contraception for 2 weeks. Recommended condoms 100% of time for STI protection. - Pregnancy, urine    Return in about 3 months (around 04/16/2019) for Depo.  Future Appointments  Date Time Provider [place]  01/21/2019  8:45  AM E[facility]-CLINICAL VISIT [facility] None    Kandee Keen, PA-C

## 2019-01-16 NOTE — Progress Notes (Signed)
PT Negative. Depo given per provider order. Hal Morales, RN

## 2019-01-16 NOTE — Progress Notes (Signed)
Last depo at [facility] was 09/03/2018; 19.2 weeks post depo today. Last physical at [facility] was 05/04/2017. See Epic record for PAP done 09/03/2018 at [facility]. Last sex was about 1 week ago without condom.

## 2019-01-21 ENCOUNTER — Other Ambulatory Visit: Payer: Self-pay

## 2019-01-21 ENCOUNTER — Ambulatory Visit: Payer: Self-pay

## 2019-01-21 DIAGNOSIS — Z0000 Encounter for general adult medical examination without abnormal findings: Secondary | ICD-10-CM

## 2019-07-28 MED ORDER — HEPATITIS B VAC RECOMBINANT 10 MCG/ML IJ SUSP
1.0000 mL | Freq: Once | INTRAMUSCULAR | Status: AC
  Administered 2019-07-29: 10 ug via INTRAMUSCULAR

## 2019-07-29 ENCOUNTER — Other Ambulatory Visit: Payer: Self-pay

## 2019-07-29 ENCOUNTER — Ambulatory Visit: Payer: Self-pay

## 2019-07-29 DIAGNOSIS — Z0000 Encounter for general adult medical examination without abnormal findings: Secondary | ICD-10-CM

## 2019-09-04 ENCOUNTER — Other Ambulatory Visit: Payer: Self-pay

## 2019-09-04 ENCOUNTER — Ambulatory Visit (INDEPENDENT_AMBULATORY_CARE_PROVIDER_SITE_OTHER): Payer: BC Managed Care – PPO | Admitting: Obstetrics and Gynecology

## 2019-09-04 ENCOUNTER — Encounter: Payer: Self-pay | Admitting: Obstetrics and Gynecology

## 2019-09-04 VITALS — BP 99/65 | HR 84 | Ht 66.0 in | Wt 186.5 lb

## 2019-09-04 DIAGNOSIS — Z01419 Encounter for gynecological examination (general) (routine) without abnormal findings: Secondary | ICD-10-CM | POA: Diagnosis not present

## 2019-09-04 NOTE — Progress Notes (Signed)
HPI:      [name] is a 34 y.o. G2P1101 who LMP was Patient's last menstrual period was 08/14/2019 (approximate).  Subjective:   She presents today for her annual examination.  She has no complaints.  She is having normal regular cycles.  She is not using anything for birth control at this time and she is "okay with this."  Her fianc is A diabetic and they have infrequent intercourse because he has some limitation of erection. She does complain of occasional burning after intercourse that she thinks is from "friction".  She believes that she is well-lubricated during intercourse. She has not yet decided upon future pregnancy. She is moderately displeased with the size of her breasts because it affects her life with the weight of them and her discomfort because of this.  She is inquiring if I know anything about breast reduction.   Hx: The following portions of the patient's history were reviewed and updated as appropriate:             She  has a past medical history of Gestational diabetes, HELLP syndrome, HPV in female, Preeclampsia (2012), and Scoliosis. She does not have any pertinent problems on file. She  has a past surgical history that includes Colposcopy. Her family history includes Breast cancer in her maternal aunt; Diabetes in her father, maternal aunt, maternal grandfather, maternal grandmother, mother, and paternal grandmother; Hypertension in her father, maternal grandfather, maternal grandmother, and mother. She  reports that she has never smoked. She has never used smokeless tobacco. She reports current alcohol use. She reports current drug use. Drug: Marijuana. She has a current medication list which includes the following prescription(s): multivitamin with minerals, and the following Facility-Administered Medications: medroxyprogesterone. She has No Known Allergies.       Review of Systems:  Review of Systems  Constitutional: Denied constitutional symptoms, night  sweats, recent illness, fatigue, fever, insomnia and weight loss.  Eyes: Denied eye symptoms, eye pain, photophobia, vision change and visual disturbance.  Ears/Nose/Throat/Neck: Denied ear, nose, throat or neck symptoms, hearing loss, nasal discharge, sinus congestion and sore throat.  Cardiovascular: Denied cardiovascular symptoms, arrhythmia, chest pain/pressure, edema, exercise intolerance, orthopnea and palpitations.  Respiratory: Denied pulmonary symptoms, asthma, pleuritic pain, productive sputum, cough, dyspnea and wheezing.  Gastrointestinal: Denied, gastro-esophageal reflux, melena, nausea and vomiting.  Genitourinary: Denied genitourinary symptoms including symptomatic vaginal discharge, pelvic relaxation issues, and urinary complaints.  Musculoskeletal: Denied musculoskeletal symptoms, stiffness, swelling, muscle weakness and myalgia.  Dermatologic: Denied dermatology symptoms, rash and scar.  Neurologic: Denied neurology symptoms, dizziness, headache, neck pain and syncope.  Psychiatric: Denied psychiatric symptoms, anxiety and depression.  Endocrine: Denied endocrine symptoms including hot flashes and night sweats.   Meds:   Current Outpatient Medications on File Prior to Visit  Medication Sig Dispense Refill  . Multiple Vitamins-Minerals (MULTIVITAMIN WITH MINERALS) tablet Take 1 tablet by mouth daily. 100 tablet 0   Current Facility-Administered Medications on File Prior to Visit  Medication Dose Route Frequency Provider Last Rate Last Admin  . medroxyPROGESTERone (DEPO-PROVERA) injection 150 mg  150 mg Intramuscular Q90 days Staples, Jenna L, PA-C   150 mg at 01/16/19 1721    Objective:     Vitals:   09/04/19 1105  BP: 99/65  Pulse: 84              Physical examination General NAD, Conversant  HEENT Atraumatic; Op clear with mmm.  Normo-cephalic. Pupils reactive. Anicteric sclerae  Thyroid/Neck Smooth without nodularity or enlargement.  Normal ROM.  Neck Supple.   Skin No rashes, lesions or ulceration. Normal palpated skin turgor. No nodularity.  Breasts: No masses or discharge.  Symmetric.  No axillary adenopathy.  Lungs: Clear to auscultation.No rales or wheezes. Normal Respiratory effort, no retractions.  Heart: NSR.  No murmurs or rubs appreciated. No periferal edema  Abdomen: Soft.  Non-tender.  No masses.  No HSM. No hernia  Extremities: Moves all appropriately.  Normal ROM for age. No lymphadenopathy.  Neuro: Oriented to PPT.  Normal mood. Normal affect.     Pelvic:   Vulva: Normal appearance.  No lesions.  Vagina: No lesions or abnormalities noted.  Support: Normal pelvic support.  Urethra No masses tenderness or scarring.  Meatus Normal size without lesions or prolapse.  Cervix: Normal appearance.  No lesions.  Anus: Normal exam.  No lesions.  Perineum: Normal exam.  No lesions.        Bimanual   Uterus: Normal size.  Non-tender.  Mobile.  AV.  Adnexae: No masses.  Non-tender to palpation.  Cul-de-sac: Negative for abnormality.      Assessment:    G2P1101 Patient Active Problem List   Diagnosis Date Noted  . History of gestational diabetes 01/16/2019  . History of gestational hypertension 01/16/2019  . Overweight 05/04/2017     1. Well woman exam with routine gynecological exam        Plan:            1.  Basic Screening Recommendations The basic screening recommendations for asymptomatic women were discussed with the patient during her visit.  The age-appropriate recommendations were discussed with her and the rational for the tests reviewed.  When I am informed by the patient that another primary care physician has previously obtained the age-appropriate tests and they are up-to-date, only outstanding tests are ordered and referrals given as necessary.  Abnormal results of tests will be discussed with her when all of her results are completed.  Routine preventative health maintenance measures emphasized:  Exercise/Diet/Weight control, Tobacco Warnings, Alcohol/Substance use risks and Stress Management Patient has a history of positive HPV and abnormal Pap smear but her last Pap showed negative HPV and no cytologic abnormality.!!! 2.  We have discussed breast reduction in some detail and I have answered all her questions to the best of my ability.  We have discussed a consultation with plastic surgery and she will consider this in the future. 3.  We have discussed the possibility of birth control and the patient does not desire this at this time.  She knows that she could become pregnant with intermittent intercourse but she thinks it will be unlikely. Orders No orders of the defined types were placed in this encounter.   No orders of the defined types were placed in this encounter.       F/U  Return in about 1 year (around 09/03/2020) for Annual Physical.  Elonda Husky, M.D. 09/04/2019 11:51 AM

## 2019-09-23 DIAGNOSIS — Z20822 Contact with and (suspected) exposure to covid-19: Secondary | ICD-10-CM | POA: Diagnosis not present

## 2020-02-10 ENCOUNTER — Telehealth: Payer: Self-pay | Admitting: Nurse Practitioner

## 2020-02-10 ENCOUNTER — Other Ambulatory Visit: Payer: Self-pay

## 2020-02-10 ENCOUNTER — Ambulatory Visit: Payer: Self-pay | Admitting: Nurse Practitioner

## 2020-02-10 DIAGNOSIS — Z1152 Encounter for screening for COVID-19: Secondary | ICD-10-CM

## 2020-02-10 DIAGNOSIS — R6889 Other general symptoms and signs: Secondary | ICD-10-CM

## 2020-02-10 DIAGNOSIS — Z20822 Contact with and (suspected) exposure to covid-19: Secondary | ICD-10-CM

## 2020-02-10 DIAGNOSIS — U071 COVID-19: Secondary | ICD-10-CM

## 2020-02-10 LAB — POCT INFLUENZA A/B
Influenza A, POC: NEGATIVE
Influenza B, POC: NEGATIVE

## 2020-02-10 LAB — POC COVID19 BINAXNOW: SARS Coronavirus 2 Ag: POSITIVE — AB

## 2020-02-10 NOTE — Progress Notes (Signed)
° °  Subjective:    Patient ID: Kathleen Leach, female    DOB: September 06, 1985, 34 y.o.   MRN: [id number]  HPI  34 year old female presenting to clinic for virtual appointment after nurse visit for COVID testing.  Was positive for COVID-19 September 1st with a home test > 90 days ago   Symptoms started on 02/08/20 runny nose and a cough   Denies any close contacts   She has been vaccinated for COVID-19    Review of Systems  Constitutional: Positive for fatigue.  HENT: Positive for congestion and rhinorrhea.   Respiratory: Positive for cough.   Cardiovascular: Negative.   Genitourinary: Negative.   Musculoskeletal: Negative.   Neurological: Negative.    Past Medical History:  Diagnosis Date   Gestational diabetes    HELLP syndrome    HPV in female    Preeclampsia 2012   Scoliosis       Objective:   Physical Exam   This was a telehealth appointment with patient after nurse visit   Patient in no acute distress during phone conversation  Recent Results (from the past 2160 hour(s))  POC COVID-19     Status: Abnormal   Collection Time: 02/10/20  2:21 PM  Result Value Ref Range   SARS Coronavirus 2 Ag Positive (A) Negative    Comment: Patient vaccinated. Patient aware of positive result. Virtual f/u with S.Monicia Tse  POCT Influenza A/B     Status: Normal   Collection Time: 02/10/20  2:22 PM  Result Value Ref Range   Influenza A, POC Negative Negative    Comment: Patient vaccinated. patient aware of negative result. Virtual f/u with S.Emile Kyllo   Influenza B, POC Negative Negative    Comment: Patient vaccinated. patient aware of negative result. Virtual f/u with S.Moxie Kalil      Assessment & Plan:  Advised continuing to manage symptoms with OTC medications   RTC if symptoms persist or with new or worsening symptoms  Work note to return after virtual appointment on 02/18/19 to assure symptoms are improving and she is fit to return to work.  Isolation per [employer] current policy  will end 02/17/19.   [facility] form faxed and HR notified of absence

## 2020-03-10 ENCOUNTER — Other Ambulatory Visit: Payer: Self-pay

## 2020-03-10 ENCOUNTER — Encounter: Payer: Self-pay | Admitting: Family Medicine

## 2020-03-10 ENCOUNTER — Ambulatory Visit (LOCAL_COMMUNITY_HEALTH_CENTER): Payer: BC Managed Care – PPO | Admitting: Family Medicine

## 2020-03-10 VITALS — BP 130/75 | Ht 67.0 in | Wt 188.0 lb

## 2020-03-10 DIAGNOSIS — Z3009 Encounter for other general counseling and advice on contraception: Secondary | ICD-10-CM

## 2020-03-10 DIAGNOSIS — Z7189 Other specified counseling: Secondary | ICD-10-CM

## 2020-03-10 NOTE — Progress Notes (Signed)
Patient in clinic wanting to start OCP.  Patient has established OBGYN and had  physical with [facility], and PAP completed in 08/2019.  Patient came her for birth control because she was able to get an appointment in 1 day verses a couple of weeks. Patient declined the need for STI testing or wanting ECP.   Discussed with patient about continuity of care and best to contact [facility] about [facility] desires.  Patient verbalizes understanding and to contact [facility] for Birth control needs.

## 2020-03-10 NOTE — Progress Notes (Signed)
Patient here for birth control pills. Patient had physical 09/04/2019 at [facility] and pap smear was normal and negative HPV.  Harvie Heck, RN

## 2020-03-15 ENCOUNTER — Telehealth: Payer: Self-pay

## 2020-03-16 NOTE — Telephone Encounter (Signed)
Error

## 2020-03-17 ENCOUNTER — Other Ambulatory Visit: Payer: Self-pay

## 2020-03-17 ENCOUNTER — Ambulatory Visit (INDEPENDENT_AMBULATORY_CARE_PROVIDER_SITE_OTHER): Payer: BC Managed Care – PPO | Admitting: Obstetrics and Gynecology

## 2020-03-17 ENCOUNTER — Encounter: Payer: Self-pay | Admitting: Obstetrics and Gynecology

## 2020-03-17 VITALS — BP 120/77 | HR 93 | Ht 67.0 in | Wt 189.7 lb

## 2020-03-17 DIAGNOSIS — Z3009 Encounter for other general counseling and advice on contraception: Secondary | ICD-10-CM

## 2020-03-17 DIAGNOSIS — Z30011 Encounter for initial prescription of contraceptive pills: Secondary | ICD-10-CM | POA: Diagnosis not present

## 2020-03-17 MED ORDER — LO LOESTRIN FE 1 MG-10 MCG / 10 MCG PO TABS: 1.0000 | ORAL_TABLET | Freq: Every day | ORAL | 3 refills | Status: DC

## 2020-03-17 NOTE — Progress Notes (Signed)
HPI:      [name] is a 35 y.o. G2P1101 who LMP was Patient's last menstrual period was 03/10/2020.  Subjective:   She presents today to discuss birth control options.  Patient previously on Depo but has been off it for several months.  She is having normal regular monthly menses.  She would like something for birth control.  She is mainly considering IUD and OCPs.  She has taken OCPs before. States that she still has occasional burning after intercourse but this has improved.    Hx: The following portions of the patient's history were reviewed and updated as appropriate:             She  has a past medical history of Gestational diabetes, HELLP syndrome, HPV in female, Preeclampsia (2012), and Scoliosis. She does not have any pertinent problems on file. She  has a past surgical history that includes Colposcopy. Her family history includes Breast cancer in her maternal aunt; Diabetes in her father, maternal aunt, maternal grandfather, maternal grandmother, mother, and paternal grandmother; Hypertension in her father, maternal grandfather, maternal grandmother, and mother. She  reports that she has never smoked. She has never used smokeless tobacco. She reports current alcohol use. She reports current drug use. Drug: Marijuana. She has a current medication list which includes the following prescription(s): multivitamin with minerals and lo loestrin fe. She has No Known Allergies.       Review of Systems:  Review of Systems  Constitutional: Denied constitutional symptoms, night sweats, recent illness, fatigue, fever, insomnia and weight loss.  Eyes: Denied eye symptoms, eye pain, photophobia, vision change and visual disturbance.  Ears/Nose/Throat/Neck: Denied ear, nose, throat or neck symptoms, hearing loss, nasal discharge, sinus congestion and sore throat.  Cardiovascular: Denied cardiovascular symptoms, arrhythmia, chest pain/pressure, edema, exercise intolerance, orthopnea and  palpitations.  Respiratory: Denied pulmonary symptoms, asthma, pleuritic pain, productive sputum, cough, dyspnea and wheezing.  Gastrointestinal: Denied, gastro-esophageal reflux, melena, nausea and vomiting.  Genitourinary: Denied genitourinary symptoms including symptomatic vaginal discharge, pelvic relaxation issues, and urinary complaints.  Musculoskeletal: Denied musculoskeletal symptoms, stiffness, swelling, muscle weakness and myalgia.  Dermatologic: Denied dermatology symptoms, rash and scar.  Neurologic: Denied neurology symptoms, dizziness, headache, neck pain and syncope.  Psychiatric: Denied psychiatric symptoms, anxiety and depression.  Endocrine: Denied endocrine symptoms including hot flashes and night sweats.   Meds:   Current Outpatient Medications on File Prior to Visit  Medication Sig Dispense Refill  . Multiple Vitamins-Minerals (MULTIVITAMIN WITH MINERALS) tablet Take 1 tablet by mouth daily. 100 tablet 0   No current facility-administered medications on file prior to visit.       The pregnancy intention screening data noted above was reviewed. Potential methods of contraception were discussed. The patient elected to proceed with Oral Contraceptive.     Objective:     Vitals:   03/17/20 0950  BP: 120/77  Pulse: 93   Filed Weights   03/17/20 0950  Weight: 189 lb 11.2 oz (86 kg)                Assessment:    G2P1101 Patient Active Problem List   Diagnosis Date Noted  . History of gestational diabetes 01/16/2019  . History of gestational hypertension 01/16/2019  . Overweight 05/04/2017     1. Birth control counseling   2. Initiation of OCP (BCP)        Plan:            1.  Birth Control I  discussed multiple birth control options and methods with the patient.  The risks and benefits of each were reviewed. IUD Literature on IUD made available.  Risks and benefits discussed.  She is considering IUD as an option for birth/cycle  control. OCPs The risks /benefits of OCPs have been explained to the patient in detail.  Product literature has been given to her where appropriate.  I have instructed her in the use of OCPs.  I have explained to the patient that OCPs are not as effective for birth control during the first month of use, and that another form of contraception should be used during this time.  Both first-day start and Sunday start have been explained.  The risks and benefits of each was discussed.  She has been made aware of  the fact that in rare circumstances, other medications may affect the efficacy of OCPs.  I have answered all of her questions, and I believe that she has an understanding of the effectiveness and use of OCPs. She has chosen OCPs and would like one that gives her a monthly cycle. Orders No orders of the defined types were placed in this encounter.    Meds ordered this encounter  Medications  . Norethindrone-Ethinyl Estradiol-Fe Biphas (LO LOESTRIN FE) 1 MG-10 MCG / 10 MCG tablet    Sig: Take 1 tablet by mouth at bedtime for 28 days.    Dispense:  28 tablet    Refill:  3      F/U  Return for Annual Physical. I spent 25 minutes involved in the care of this patient preparing to see the patient by obtaining and reviewing her medical history (including labs, imaging tests and prior procedures), documenting clinical information in the electronic health record (EHR), counseling and coordinating care plans, writing and sending prescriptions, ordering tests or procedures and directly communicating with the patient by discussing pertinent items from her history and physical exam as well as detailing my assessment and plan as noted above so that she has an informed understanding.  All of her questions were answered.  Elonda Husky, M.D. 03/17/2020 10:47 AM

## 2020-04-27 ENCOUNTER — Telehealth: Payer: Self-pay

## 2020-04-27 NOTE — Telephone Encounter (Signed)
Spoke with patient on phone with questions regarding covid booster vaccine. Educated pateint that covid booster should be at least 5 months after last covid vaccine. Informed patient she could receive whichever booster she preferred. Also educated patient that she must wait 14 days after being diagnosed with Covid before receiving covid booster shot, and not have any covid symptoms.

## 2020-04-30 DIAGNOSIS — Z23 Encounter for immunization: Secondary | ICD-10-CM | POA: Diagnosis not present

## 2020-05-30 ENCOUNTER — Emergency Department
Admission: EM | Admit: 2020-05-30 | Discharge: 2020-05-30 | Disposition: A | Discharge status: Home / Self Care | Payer: BC Managed Care – PPO | Attending: Emergency Medicine | Admitting: Emergency Medicine

## 2020-05-30 ENCOUNTER — Encounter: Payer: Self-pay | Admitting: Emergency Medicine

## 2020-05-30 ENCOUNTER — Other Ambulatory Visit: Payer: Self-pay

## 2020-05-30 DIAGNOSIS — K0889 Other specified disorders of teeth and supporting structures: Secondary | ICD-10-CM | POA: Insufficient documentation

## 2020-05-30 MED ORDER — AMOXICILLIN 500 MG PO CAPS: 500.0000 mg | ORAL_CAPSULE | Freq: Three times a day (TID) | ORAL | 0 refills | Status: DC

## 2020-05-30 MED ORDER — KETOROLAC TROMETHAMINE 30 MG/ML IJ SOLN
30.0000 mg | Freq: Once | INTRAMUSCULAR | Status: AC
  Administered 2020-05-30: 30 mg via INTRAMUSCULAR
  Filled 2020-05-30: qty 1

## 2020-05-30 NOTE — ED Triage Notes (Signed)
Presents with possible dental abscess  States she having pain to right upper and lower gumline

## 2020-05-30 NOTE — ED Provider Notes (Signed)
[facility] Emergency Department Provider Note  ____________________________________________   Event Date/Time   First MD Initiated Contact with Patient 05/30/20 1101     (approximate)  I have reviewed the triage vital signs and the nursing notes.   HISTORY  Chief Complaint Dental Pain    HPI Kathleen Leach is a 35 y.o. female presents emergency department with dental pain.  States she is having pain to the right upper and lower gum lines.  No fever or chills.  Taking ibuprofen 600 mg without relief.  Symptoms for 3 days    Past Medical History:  Diagnosis Date  . Gestational diabetes    during pregnancy  . HELLP syndrome   . HPV in female   . Preeclampsia 2012  . Scoliosis     Patient Active Problem List   Diagnosis Date Noted  . History of gestational diabetes 01/16/2019  . History of gestational hypertension 01/16/2019  . Overweight 05/04/2017    Past Surgical History:  Procedure Laterality Date  . COLPOSCOPY      Prior to Admission medications   Medication Sig Start Date End Date Taking? Authorizing Provider  amoxicillin (AMOXIL) 500 MG capsule Take 1 capsule (500 mg total) by mouth 3 (three) times daily. 05/30/20  Yes Georgianna Band, Roselyn Bering, PA-C  Multiple Vitamins-Minerals (MULTIVITAMIN WITH MINERALS) tablet Take 1 tablet by mouth daily. 09/03/18   Larene Pickett, FNP  Norethindrone-Ethinyl Estradiol-Fe Biphas (LO LOESTRIN FE) 1 MG-10 MCG / 10 MCG tablet Take 1 tablet by mouth at bedtime for 28 days. 03/17/20 04/14/20  Linzie Collin, MD    Allergies Patient has no known allergies.  Family History  Problem Relation Age of Onset  . Diabetes Mother   . Hypertension Mother   . Breast cancer Maternal Aunt   . Diabetes Maternal Aunt   . Diabetes Father   . Hypertension Father   . Diabetes Maternal Grandmother   . Hypertension Maternal Grandmother   . Diabetes Maternal Grandfather   . Hypertension Maternal Grandfather   . Diabetes  Paternal Grandmother     Social History Social History   Tobacco Use  . Smoking status: Never Smoker  . Smokeless tobacco: Never Used  Vaping Use  . Vaping Use: Never used  Substance Use Topics  . Alcohol use: Yes    Comment: occasions  . Drug use: Yes    Types: Marijuana    Review of Systems  Constitutional: No fever/chills Eyes: No visual changes. ENT: No sore throat. Respiratory: Denies cough Cardiovascular: Denies chest pain Gastrointestinal: Denies abdominal pain Genitourinary: Negative for dysuria. Musculoskeletal: Negative for back pain. Skin: Negative for rash. Psychiatric: no mood changes,     ____________________________________________   PHYSICAL EXAM:  VITAL SIGNS: ED Triage Vitals  Enc Vitals Group     BP 05/30/20 1108 125/78     Pulse Rate 05/30/20 1108 78     Resp 05/30/20 1108 18     Temp 05/30/20 1108 98 F (36.7 C)     Temp Source 05/30/20 1108 Oral     SpO2 05/30/20 1108 98 %     Weight 05/30/20 1059 180 lb (81.6 kg)     Height 05/30/20 1059 5\' 7"  (1.702 m)     Head Circumference --      Peak Flow --      Pain Score 05/30/20 1059 7     Pain Loc --      Pain Edu? --      Excl. in  GC? --     Constitutional: Alert and oriented. Well appearing and in no acute distress. Eyes: Conjunctivae are normal.  Head: Atraumatic. Nose: No congestion/rhinnorhea. Mouth/Throat: Mucous membranes are moist.  Positive for erupting wisdom tooth, some gum swelling noted, no drainage Neck:  supple no lymphadenopathy noted Cardiovascular: Normal rate, regular rhythm. Heart sounds are normal Respiratory: Normal respiratory effort.  No retractions, lungs c t a  GU: deferred Musculoskeletal: FROM all extremities, warm and well perfused Neurologic:  Normal speech and language.  Skin:  Skin is warm, dry and intact. No rash noted. Psychiatric: Mood and affect are normal. Speech and behavior are normal.  ____________________________________________    LABS (all labs ordered are listed, but only abnormal results are displayed)  Labs Reviewed - No data to display ____________________________________________   ____________________________________________  RADIOLOGY    ____________________________________________   PROCEDURES  Procedure(s) performed: No  Procedures    ____________________________________________   INITIAL IMPRESSION / ASSESSMENT AND PLAN / ED COURSE  Pertinent labs & imaging results that were available during my care of the patient were reviewed by me and considered in my medical decision making (see chart for details).   Patient is 35 year old female presents with tooth pain.  See HPI.  Physical exam shows patient per stable.  Patient was given Toradol 30 mg IM.  Prescription for amoxicillin.  Discharged stable condition.  Instructed follow-up with dental clinic.  Return if worsening.  Take medications as prescribed.     Kathleen Leach was evaluated in Emergency Department on 05/30/2020 for the symptoms described in the history of present illness. She was evaluated in the context of the global COVID-19 pandemic, which necessitated consideration that the patient might be at risk for infection with the SARS-CoV-2 virus that causes COVID-19. Institutional protocols and algorithms that pertain to the evaluation of patients at risk for COVID-19 are in a state of rapid change based on information released by regulatory bodies including the CDC and federal and state organizations. These policies and algorithms were followed during the patient's care in the ED.    As part of my medical decision making, I reviewed the following data within the electronic MEDICAL RECORD NUMBER Nursing notes reviewed and incorporated, Old chart reviewed, Notes from prior ED visits and [place] Controlled Substance Database  ____________________________________________   FINAL CLINICAL IMPRESSION(S) / ED DIAGNOSES  Final diagnoses:  Pain,  dental      NEW MEDICATIONS STARTED DURING THIS VISIT:  New Prescriptions   AMOXICILLIN (AMOXIL) 500 MG CAPSULE    Take 1 capsule (500 mg total) by mouth 3 (three) times daily.     Note:  This document was prepared using Dragon voice recognition software and may include unintentional dictation errors.    Faythe Ghee, PA-C 05/30/20 1132    Gilles Chiquito, MD 05/30/20 [id number]

## 2020-05-30 NOTE — Discharge Instructions (Addendum)
Follow-up with your regular dentist or [facility] dental clinic.  Please call for an appointment.  Take amoxicillin as prescribed.  Add Tylenol intermittently between the ibuprofen doses.

## 2020-06-30 ENCOUNTER — Encounter: Payer: Self-pay | Admitting: Medical

## 2020-06-30 ENCOUNTER — Ambulatory Visit: Payer: BC Managed Care – PPO

## 2020-06-30 ENCOUNTER — Telehealth: Payer: BC Managed Care – PPO | Admitting: Medical

## 2020-06-30 ENCOUNTER — Other Ambulatory Visit: Payer: Self-pay

## 2020-06-30 DIAGNOSIS — R059 Cough, unspecified: Secondary | ICD-10-CM

## 2020-06-30 DIAGNOSIS — R6889 Other general symptoms and signs: Secondary | ICD-10-CM

## 2020-06-30 DIAGNOSIS — R5383 Other fatigue: Secondary | ICD-10-CM

## 2020-06-30 DIAGNOSIS — Z1152 Encounter for screening for COVID-19: Secondary | ICD-10-CM

## 2020-06-30 DIAGNOSIS — Z20822 Contact with and (suspected) exposure to covid-19: Secondary | ICD-10-CM

## 2020-06-30 LAB — POC COVID19 BINAXNOW: SARS Coronavirus 2 Ag: NEGATIVE

## 2020-06-30 LAB — POCT INFLUENZA A/B
Influenza A, POC: NEGATIVE
Influenza B, POC: NEGATIVE

## 2020-06-30 NOTE — Progress Notes (Signed)
   Subjective:    Patient ID: Kathleen Leach, female    DOB: 1985-06-17, 35 y.o.   MRN: [id number]  HPI 35 yo female in non acute,, consents to telemedicine appointment. not feeling well, HA, fatigue,  History of chronic cough ( non productive) (recommended she see her PCP) sneezing, feeling weaty  On/off and hot feeling at times some abdominal discomfort and diarrhea though she thought it was food she ate.. Denies shortness of breath and chest pain. No loss of taste or smell. Had Covid-19 in Sept 2021 and December 2021.   Moderna vaccinated and boosted.  Review of Systems  Constitutional: Positive for fatigue. Negative for chills.  HENT: Positive for postnasal drip, rhinorrhea and sneezing. Negative for sinus pressure, sinus pain and sore throat.   Respiratory: Positive for cough. Negative for shortness of breath.   Cardiovascular: Positive for chest pain.  Gastrointestinal: Positive for abdominal pain and diarrhea.  Musculoskeletal: Negative for myalgias.  Skin: Negative for color change and rash.  Allergic/Immunologic: Positive for environmental allergies.  Neurological: Positive for weakness and headaches.   preg test negative at home test     Objective:   Physical Exam AXOX3  No acute distress on phone call No physical exam performed due to telemedicine appointment.      PCR pending Recent Results (from the past 2160 hour(s))  POC COVID-19     Status: Normal   Collection Time: 06/30/20 10:28 AM  Result Value Ref Range   SARS Coronavirus 2 Ag Negative Negative    Comment: Patient vaccinated. Patient aware of negative result. Follow up with [facility]  POCT Influenza A/B     Status: Normal   Collection Time: 06/30/20 10:28 AM  Result Value Ref Range   Influenza A, POC Negative Negative   Influenza B, POC Negative Negative   Assessment & Plan:  Covid-19  Screening Fatigue, Headache. PCR Pending Isolate , rest , increase fluids, take OTC Motrin for fever or for pain  per package instructions. Patient verbalizes understanding and has no questions at the end of our conversation. Information given for PCP , the doctors listed are her OB/GYN and are not acting as her PCP.

## 2020-06-30 NOTE — Patient Instructions (Signed)
Cough, Adult A cough helps to clear your throat and lungs. A cough may be a sign of an illness or another medical condition. An acute cough may only last 2-3 weeks, while a chronic cough may last 8 or more weeks. Many things can cause a cough. They include:  Germs (viruses or bacteria) that attack the airway.  Breathing in things that bother (irritate) your lungs.  Allergies.  Asthma.  Mucus that runs down the back of your throat (postnasal drip).  Smoking.  Acid backing up from the stomach into the tube that moves food from the mouth to the stomach (gastroesophageal reflux).  Some medicines.  Lung problems.  Other medical conditions, such as heart failure or a blood clot in the lung (pulmonary embolism). Follow these instructions at home: Medicines  Take over-the-counter and prescription medicines only as told by your doctor.  Talk with your doctor before you take medicines that stop a cough (cough suppressants). Lifestyle  Do not smoke, and try not to be around smoke. Do not use any products that contain nicotine or tobacco, such as cigarettes, e-cigarettes, and chewing tobacco. If you need help quitting, ask your doctor.  Drink enough fluid to keep your pee (urine) pale yellow.  Avoid caffeine.  Do not drink alcohol if your doctor tells you not to drink.   General instructions  Watch for any changes in your cough. Tell your doctor about them.  Always cover your mouth when you cough.  Stay away from things that make you cough, such as perfume, candles, campfire smoke, or cleaning products.  If the air is dry, use a cool mist vaporizer or humidifier in your home.  If your cough is worse at night, try using extra pillows to raise your head up higher while you sleep.  Rest as needed.  Keep all follow-up visits as told by your doctor. This is important.   Contact a doctor if:  You have new symptoms.  You cough up pus.  Your cough does not get better after 2-3  weeks, or your cough gets worse.  Cough medicine does not help your cough and you are not sleeping well.  You have pain that gets worse or pain that is not helped with medicine.  You have a fever.  You are losing weight and you do not know why.  You have night sweats. Get help right away if:  You cough up blood.  You have trouble breathing.  Your heartbeat is very fast. These symptoms may be an emergency. Do not wait to see if the symptoms will go away. Get medical help right away. Call your local emergency services (911 in the [place] Do not drive yourself to the hospital. Summary  A cough helps to clear your throat and lungs. Many things can cause a cough.  Take over-the-counter and prescription medicines only as told by your doctor.  Always cover your mouth when you cough.  Contact a doctor if you have new symptoms or you have a cough that does not get better or gets worse. This information is not intended to replace advice given to you by your health care provider. Make sure you discuss any questions you have with your health care provider. Document Revised: 03/21/2019 Document Reviewed: 02/18/2018 Elsevier Patient Education  2021 [place]: What to Do if You Are Sick If you have a fever, cough or other symptoms, you might have COVID-19. Most people have mild illness and are able to recover at home. If  you are sick:  Keep track of your symptoms.  If you have an emergency warning sign (including trouble breathing), call 911. Steps to help prevent the spread of COVID-19 if you are sick If you are sick with COVID-19 or think you might have COVID-19, follow the steps below to care for yourself and to help protect other people in your home and community. Stay home except to get medical care  Stay home. Most people with COVID-19 have mild illness and can recover at home without medical care. Do not leave your home, except to get medical care. Do not visit public  areas.  Take care of yourself. Get rest and stay hydrated. Take over-the-counter medicines, such as acetaminophen, to help you feel better.  Stay in touch with your doctor. Call before you get medical care. Be sure to get care if you have trouble breathing, or have any other emergency warning signs, or if you think it is an emergency.  Avoid public transportation, ride-sharing, or taxis. Separate yourself from other people As much as possible, stay in a specific room and away from other people and pets in your home. If possible, you should use a separate bathroom. If you need to be around other people or animals in or outside of the home, wear a mask. Tell your close contactsthat they may have been exposed to COVID-19. An infected person can spread COVID-19 starting 48 hours (or 2 days) before the person has any symptoms or tests positive. By letting your close contacts know they may have been exposed to COVID-19, you are helping to protect everyone.  Additional guidance is available for those living in close quarters and shared housing.  See COVID-19 and Animals if you have questions about pets.  If you are diagnosed with COVID-19, someone from the health department may call you. Answer the call to slow the spread. Monitor your symptoms  Symptoms of COVID-19 include fever, cough, or other symptoms.  Follow care instructions from your healthcare provider and local health department. Your local health authorities may give instructions on checking your symptoms and reporting information. When to seek emergency medical attention Look for emergency warning signs* for COVID-19. If someone is showing any of these signs, seek emergency medical care immediately:  Trouble breathing  Persistent pain or pressure in the chest  New confusion  Inability to wake or stay awake  Pale, gray, or blue-colored skin, lips, or nail beds, depending on skin tone *This list is not all possible symptoms.  Please call your medical provider for any other symptoms that are severe or concerning to you. Call 911 or call ahead to your local emergency facility: Notify the operator that you are seeking care for someone who has or may have COVID-19. Call ahead before visiting your doctor  Call ahead. Many medical visits for routine care are being postponed or done by phone or telemedicine.  If you have a medical appointment that cannot be postponed, call your doctor's office, and tell them you have or may have COVID-19. This will help the office protect themselves and other patients. Get  tested  If you have symptoms of COVID-19, get tested. While waiting for test results, you stay away from others, including staying apart from those living in your household.  You can visit your state, tribal, local, and territorialhealth department's website to look for the latest local information on testing sites. If you are sick, wear a mask over your nose and mouth  You should wear a mask  over your nose and mouth if you must be around other people or animals, including pets (even at home).  You don't need to wear the mask if you are alone. If you can't put on a mask (because of trouble breathing, for example), cover your coughs and sneezes in some other way. Try to stay at least 6 feet away from other people. This will help protect the people around you.  Masks should not be placed on young children under age 45 years, anyone who has trouble breathing, or anyone who is not able to remove the mask without help. Note: During the COVID-19 pandemic, medical grade facemasks are reserved for healthcare workers and some first responders. Cover your coughs and sneezes  Cover your mouth and nose with a tissue when you cough or sneeze.  Throw away used tissues in a lined trash can.  Immediately wash your hands with soap and water for at least 20 seconds. If soap and water are not available, clean your hands with an  alcohol-based hand sanitizer that contains at least 60% alcohol. Clean your hands often  Wash your hands often with soap and water for at least 20 seconds. This is especially important after blowing your nose, coughing, or sneezing; going to the bathroom; and before eating or preparing food.  Use hand sanitizer if soap and water are not available. Use an alcohol-based hand sanitizer with at least 60% alcohol, covering all surfaces of your hands and rubbing them together until they feel dry.  Soap and water are the best option, especially if hands are visibly dirty.  Avoid touching your eyes, nose, and mouth with unwashed hands.  Handwashing Tips Avoid sharing personal household items  Do not share dishes, drinking glasses, cups, eating utensils, towels, or bedding with other people in your home.  Wash these items thoroughly after using them with soap and water or put in the dishwasher. Clean all "high-touch" surfaces everyday  Clean and disinfect high-touch surfaces in your "sick room" and bathroom; wear disposable gloves. Let someone else clean and disinfect surfaces in common areas, but you should clean your bedroom and bathroom, if possible.  If a caregiver or other person needs to clean and disinfect a sick person's bedroom or bathroom, they should do so on an as-needed basis. The caregiver/other person should wear a mask and disposable gloves prior to cleaning. They should wait as long as possible after the person who is sick has used the bathroom before coming in to clean and use the bathroom. ? High-touch surfaces include phones, remote controls, counters, tabletops, doorknobs, bathroom fixtures, toilets, keyboards, tablets, and bedside tables.  Clean and disinfect areas that may have blood, stool, or body fluids on them.  Use household cleaners and disinfectants. Clean the area or item with soap and water or another detergent if it is dirty. Then, use a household disinfectant. ? Be  sure to follow the instructions on the label to ensure safe and effective use of the product. Many products recommend keeping the surface wet for several minutes to ensure germs are killed. Many also recommend precautions such as wearing gloves and making sure you have good ventilation during use of the product. ? Use a product from [employer]: Disinfectants for Coronavirus (WUJWJ-19). ? Complete Disinfection Guidance When you can be around others after being sick with COVID-19 Deciding when you can be around others is different for different situations. Find out when you can safely end home isolation. For any additional questions about  your care, contact your healthcare provider or state or local health department. 04/30/2019 Content source: [facility] for Immunization and Respiratory Diseases ([facility]), Division of Viral Diseases This information is not intended to replace advice given to you by your health care provider. Make sure you discuss any questions you have with your health care provider. Document Revised: 12/15/2019 Document Reviewed: 12/15/2019 Elsevier Patient Education  2021 [employer].

## 2020-07-01 ENCOUNTER — Telehealth: Payer: Self-pay | Admitting: Nurse Practitioner

## 2020-07-01 LAB — NOVEL CORONAVIRUS, NAA: SARS-CoV-2, NAA: NOT DETECTED

## 2020-07-01 LAB — SARS-COV-2, NAA 2 DAY TAT

## 2020-07-01 NOTE — Telephone Encounter (Signed)
Spoke with patient regarding negative COVID-19 results.   She is feeling improved has had some ongoing temperature fluctuations.   She denies congestion or cough.   Had COVID in December of 2021  She may return to work if symptoms persist or with new concerns please schedule follow up with [facility] as discussed    Recent Results (from the past 2160 hour(s))  POC COVID-19     Status: Normal   Collection Time: 06/30/20 10:28 AM  Result Value Ref Range   SARS Coronavirus 2 Ag Negative Negative    Comment: Patient vaccinated. Patient aware of negative result. Follow up with [facility]  POCT Influenza A/B     Status: Normal   Collection Time: 06/30/20 10:28 AM  Result Value Ref Range   Influenza A, POC Negative Negative   Influenza B, POC Negative Negative  Novel Coronavirus, NAA ([facility])     Status: None   Collection Time: 06/30/20 10:30 AM   Specimen: Nasopharyngeal(NP) swabs in vial transport medium   Nasopharynge  Result Value Ref Range   SARS-CoV-2, NAA Not Detected Not Detected    Comment: This nucleic acid amplification test was developed and its performance characteristics determined by [employer]. Nucleic acid amplification tests include RT-PCR and TMA. This test has not been FDA cleared or approved. This test has been authorized by FDA under an Emergency Use Authorization (EUA). This test is only authorized for the duration of time the declaration that circumstances exist justifying the authorization of the emergency use of in vitro diagnostic tests for detection of SARS-CoV-2 virus and/or diagnosis of COVID-19 infection under section 564(b)(1) of the Act, 21 U.S.C. 098JXB-1(Y) (1), unless the authorization is terminated or revoked sooner. When diagnostic testing is negative, the possibility of a false negative result should be considered in the context of a patient's recent exposures and the presence of clinical signs and symptoms consistent with COVID-19. An  individual without symptoms of COVID-19 and who is not shedding SARS-CoV-2 virus wo uld expect to have a negative (not detected) result in this assay.   SARS-COV-2, NAA 2 DAY TAT     Status: None   Collection Time: 06/30/20 10:30 AM   Nasopharynge  Result Value Ref Range   SARS-CoV-2, NAA 2 DAY TAT Performed

## 2020-08-18 ENCOUNTER — Other Ambulatory Visit: Payer: Self-pay

## 2020-08-18 ENCOUNTER — Ambulatory Visit: Payer: BC Managed Care – PPO | Admitting: Medical

## 2020-08-18 ENCOUNTER — Ambulatory Visit: Payer: BC Managed Care – PPO | Admitting: Nurse Practitioner

## 2020-08-18 VITALS — BP 122/86 | HR 88 | Temp 97.7°F | Resp 16 | Wt 183.6 lb

## 2020-08-18 DIAGNOSIS — M25561 Pain in right knee: Secondary | ICD-10-CM

## 2020-08-19 NOTE — Progress Notes (Signed)
   Subjective:    Patient ID: Kathleen Leach, female    DOB: Feb 27, 1985, 35 y.o.   MRN: [id number]  HPI  35 year old female presenting to [facility] today with complaints of pain in her right knee for the past 4 months. The pain is intermittent, typically worse after a long work day.  Denies any known injury.  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   The pain is her knee is worse when she keeps her leg straight for prolonged periods of time.  She typically wears Crocs to work.  Also notes she has a history of scoliosis has seen a chiropractor in the past.   Pain is to lateral component of knee area without pain to patella or central knee, denies any pain behind her knee.   She has taken tylenol intermittently for pain relief.   Today's Vitals   08/18/20 1510  BP: 122/86  Pulse: 88  Resp: 16  Temp: 97.7 F (36.5 C)  TempSrc: Tympanic  SpO2: 98%  Weight: 183 lb 9.6 oz (83.3 kg)   Body mass index is 28.76 kg/m.   Review of Systems  Constitutional: Negative.   HENT: Negative.    Respiratory: Negative.    Cardiovascular: Negative.   Musculoskeletal:  Positive for arthralgias.  Skin: Negative.    Past Medical History:  Diagnosis Date   Gestational diabetes    during pregnancy   HELLP syndrome    HPV in female    Preeclampsia 2012   Scoliosis      Current Outpatient Medications  Medication Instructions   Multiple Vitamins-Minerals (MULTIVITAMIN WITH MINERALS) tablet 1 tablet, Oral, Daily       Objective:   Physical Exam Constitutional:      Appearance: Normal appearance.  HENT:     Head: Normocephalic.  Musculoskeletal:     Right knee: No swelling, deformity, effusion, erythema, bony tenderness or crepitus. Normal range of motion. Tenderness present over the LCL.     Left knee: Normal.       Legs:  Skin:    General: Skin is warm and dry.  Neurological:      General: No focal deficit present.     Mental Status: She is alert.  Psychiatric:        Mood and Affect: Mood normal.          Assessment & Plan:  1. Right knee pain, unspecified chronicity Recommended supportive elastic knee brace to wear at work.  Knee exercises emailed to patient  Ibuprofen 600mg  with food as needed for pain & inflammation relief Advised insert in supportive tennis shoe when working  May Ice knee for up to 10 minutes ever hour.   If pain persists or worsen will consider referral to Ortho as discussed.  Return to clinic as needed

## 2020-09-08 ENCOUNTER — Other Ambulatory Visit: Payer: Self-pay

## 2020-09-08 ENCOUNTER — Encounter: Payer: Self-pay | Admitting: Obstetrics and Gynecology

## 2020-09-08 ENCOUNTER — Other Ambulatory Visit: Payer: Self-pay | Admitting: Obstetrics and Gynecology

## 2020-09-08 ENCOUNTER — Ambulatory Visit (INDEPENDENT_AMBULATORY_CARE_PROVIDER_SITE_OTHER): Payer: BC Managed Care – PPO | Admitting: Obstetrics and Gynecology

## 2020-09-08 VITALS — BP 115/74 | HR 82 | Ht 66.5 in | Wt 187.2 lb

## 2020-09-08 DIAGNOSIS — Z3041 Encounter for surveillance of contraceptive pills: Secondary | ICD-10-CM

## 2020-09-08 DIAGNOSIS — Z01419 Encounter for gynecological examination (general) (routine) without abnormal findings: Secondary | ICD-10-CM

## 2020-09-08 DIAGNOSIS — N951 Menopausal and female climacteric states: Secondary | ICD-10-CM | POA: Diagnosis not present

## 2020-09-08 NOTE — Progress Notes (Signed)
HPI:      [name] is a 35 y.o. G2P1101 who LMP was Patient's last menstrual period was 08/12/2020.  Subjective:   She presents today for her annual examination.  She states that she did not begin her OCPs because she had some confusion regarding how to start them correctly.  She is still considering methods of birth control and cycle control.  She reports that she has normal regular menses.  Several years ago she had the Nexplanon and was happy with that. Additionally she complains of hot flashes which occurs almost every day.  She is wondering if she could be in menopause.  And if not we will could be causing these temperature swings.    Hx: The following portions of the patient's history were reviewed and updated as appropriate:             She  has a past medical history of Gestational diabetes, HELLP syndrome, HPV in female, Preeclampsia (2012), and Scoliosis. She does not have any pertinent problems on file. She  has a past surgical history that includes Colposcopy. Her family history includes Breast cancer in her maternal aunt; Diabetes in her father, maternal aunt, maternal grandfather, maternal grandmother, mother, and paternal grandmother; Hypertension in her father, maternal grandfather, maternal grandmother, and mother. She  reports that she has never smoked. She has never used smokeless tobacco. She reports current alcohol use. She reports current drug use. Drug: Marijuana. She has a current medication list which includes the following prescription(s): multivitamin with minerals. She has No Known Allergies.       Review of Systems:  Review of Systems  Constitutional: Denied constitutional symptoms, night sweats, recent illness, fatigue, fever, insomnia and weight loss.  Eyes: Denied eye symptoms, eye pain, photophobia, vision change and visual disturbance.  Ears/Nose/Throat/Neck: Denied ear, nose, throat or neck symptoms, hearing loss, nasal discharge, sinus congestion  and sore throat.  Cardiovascular: Denied cardiovascular symptoms, arrhythmia, chest pain/pressure, edema, exercise intolerance, orthopnea and palpitations.  Respiratory: Denied pulmonary symptoms, asthma, pleuritic pain, productive sputum, cough, dyspnea and wheezing.  Gastrointestinal: Denied, gastro-esophageal reflux, melena, nausea and vomiting.  Genitourinary: Denied genitourinary symptoms including symptomatic vaginal discharge, pelvic relaxation issues, and urinary complaints.  Musculoskeletal: Denied musculoskeletal symptoms, stiffness, swelling, muscle weakness and myalgia.  Dermatologic: Denied dermatology symptoms, rash and scar.  Neurologic: Denied neurology symptoms, dizziness, headache, neck pain and syncope.  Psychiatric: Denied psychiatric symptoms, anxiety and depression.  Endocrine: See HPI for additional information.   Meds:   Current Outpatient Medications on File Prior to Visit  Medication Sig Dispense Refill   Multiple Vitamins-Minerals (MULTIVITAMIN WITH MINERALS) tablet Take 1 tablet by mouth daily. 100 tablet 0   No current facility-administered medications on file prior to visit.       Objective:     Vitals:   09/08/20 1438  BP: 115/74  Pulse: 82    Filed Weights   09/08/20 1438  Weight: 187 lb 3.2 oz (84.9 kg)              Physical examination General NAD, Conversant  HEENT Atraumatic; Op clear with mmm.  Normo-cephalic. Pupils reactive. Anicteric sclerae  Thyroid/Neck Smooth with bilateral enlargement-no nodules noted  Skin No rashes, lesions or ulceration. Normal palpated skin turgor. No nodularity.  Breasts: No masses or discharge.  Symmetric.  No axillary adenopathy.  Lungs: Clear to auscultation.No rales or wheezes. Normal Respiratory effort, no retractions.  Heart: NSR.  No murmurs or rubs appreciated. No periferal edema  Abdomen: Soft.  Non-tender.  No masses.  No HSM. No hernia  Extremities: Moves all appropriately.  Normal ROM for age.  No lymphadenopathy.  Neuro: Oriented to PPT.  Normal mood. Normal affect.     Pelvic:   Vulva: Normal appearance.  No lesions.  Vagina: No lesions or abnormalities noted.  Support: Normal pelvic support.  Urethra No masses tenderness or scarring.  Meatus Normal size without lesions or prolapse.  Cervix: Normal appearance.  No lesions.  Anus: Normal exam.  No lesions.  Perineum: Normal exam.  No lesions.        Bimanual   Uterus: Normal size.  Non-tender.  Mobile.  AV.  Adnexae: No masses.  Non-tender to palpation.  Cul-de-sac: Negative for abnormality.     Assessment:    G2P1101 Patient Active Problem List   Diagnosis Date Noted   History of gestational diabetes 01/16/2019   History of gestational hypertension 01/16/2019   Overweight 05/04/2017     1. Well woman exam with routine gynecological exam   2. Surveillance for birth control, oral contraceptives   3. Symptomatic menopausal or female climacteric states     Normal exam-possibly diffusely enlarged thyroid  As patient having temperature swings, strongly doubt menopause as patient having normal regular menses and quite young for menopause but it is possible this may be secondary to a thyroid issue i.e. Graves' disease.    Plan:            1.  Basic Screening Recommendations The basic screening recommendations for asymptomatic women were discussed with the patient during her visit.  The age-appropriate recommendations were discussed with her and the rational for the tests reviewed.  When I am informed by the patient that another primary care physician has previously obtained the age-appropriate tests and they are up-to-date, only outstanding tests are ordered and referrals given as necessary.  Abnormal results of tests will be discussed with her when all of her results are completed.  Routine preventative health maintenance measures emphasized: Exercise/Diet/Weight control, Tobacco Warnings, Alcohol/Substance use risks  and Stress Management Thyroid panel and A1c ordered.  2.  Discussed multiple birth control methods in detail with the patient we specifically discussed IUD again and she will consider this. Orders Orders Placed This Encounter  Procedures   Hemoglobin A1c   Lipid panel   TSH   Hemoglobin A1c   Thyroid Panel With TSH    No orders of the defined types were placed in this encounter.        F/U  Return in about 1 year (around 09/08/2021) for Annual Physical.  Elonda Husky, M.D. 09/08/2020 3:18 PM

## 2020-09-09 LAB — THYROID PANEL WITH TSH
Free Thyroxine Index: 1.4 (ref 1.2–4.9)
T3 Uptake Ratio: 22 % — ABNORMAL LOW (ref 24–39)
T4, Total: 6.2 ug/dL (ref 4.5–12.0)
TSH: 1 u[IU]/mL (ref 0.450–4.500)

## 2020-09-09 LAB — HEMOGLOBIN A1C
Est. average glucose Bld gHb Est-mCnc: 123 mg/dL
Hgb A1c MFr Bld: 5.9 % — ABNORMAL HIGH (ref 4.8–5.6)

## 2020-10-07 ENCOUNTER — Encounter: Payer: Self-pay | Admitting: Obstetrics and Gynecology

## 2020-10-07 ENCOUNTER — Ambulatory Visit (INDEPENDENT_AMBULATORY_CARE_PROVIDER_SITE_OTHER): Payer: BC Managed Care – PPO | Admitting: Obstetrics and Gynecology

## 2020-10-07 ENCOUNTER — Other Ambulatory Visit: Payer: Self-pay

## 2020-10-07 VITALS — BP 107/72 | HR 65 | Ht 66.0 in | Wt 184.4 lb

## 2020-10-07 DIAGNOSIS — Z30430 Encounter for insertion of intrauterine contraceptive device: Secondary | ICD-10-CM | POA: Diagnosis not present

## 2020-10-07 LAB — POCT URINE PREGNANCY: Preg Test, Ur: NEGATIVE

## 2020-10-07 NOTE — Progress Notes (Signed)
HPI:      [name] is a 35 y.o. G2P1101 who LMP was No LMP recorded.  Subjective:   She presents today for IUD insertion.  She would like birth control and cycle control.  She has chosen Mirena IUD.    Hx: The following portions of the patient's history were reviewed and updated as appropriate:             She  has a past medical history of Gestational diabetes, HELLP syndrome, HPV in female, Preeclampsia (2012), and Scoliosis. She does not have any pertinent problems on file. She  has a past surgical history that includes Colposcopy. Her family history includes Breast cancer in her maternal aunt; Diabetes in her father, maternal aunt, maternal grandfather, maternal grandmother, mother, and paternal grandmother; Hypertension in her father, maternal grandfather, maternal grandmother, and mother. She  reports that she has never smoked. She has never used smokeless tobacco. She reports current alcohol use. She reports current drug use. Drug: Marijuana. She has a current medication list which includes the following prescription(s): multivitamin with minerals. She has No Known Allergies.       Review of Systems:  Review of Systems  Constitutional: Denied constitutional symptoms, night sweats, recent illness, fatigue, fever, insomnia and weight loss.  Eyes: Denied eye symptoms, eye pain, photophobia, vision change and visual disturbance.  Ears/Nose/Throat/Neck: Denied ear, nose, throat or neck symptoms, hearing loss, nasal discharge, sinus congestion and sore throat.  Cardiovascular: Denied cardiovascular symptoms, arrhythmia, chest pain/pressure, edema, exercise intolerance, orthopnea and palpitations.  Respiratory: Denied pulmonary symptoms, asthma, pleuritic pain, productive sputum, cough, dyspnea and wheezing.  Gastrointestinal: Denied, gastro-esophageal reflux, melena, nausea and vomiting.  Genitourinary: Denied genitourinary symptoms including symptomatic vaginal discharge, pelvic  relaxation issues, and urinary complaints.  Musculoskeletal: Denied musculoskeletal symptoms, stiffness, swelling, muscle weakness and myalgia.  Dermatologic: Denied dermatology symptoms, rash and scar.  Neurologic: Denied neurology symptoms, dizziness, headache, neck pain and syncope.  Psychiatric: Denied psychiatric symptoms, anxiety and depression.  Endocrine: Denied endocrine symptoms including hot flashes and night sweats.   Meds:   Current Outpatient Medications on File Prior to Visit  Medication Sig Dispense Refill   Multiple Vitamins-Minerals (MULTIVITAMIN WITH MINERALS) tablet Take 1 tablet by mouth daily. 100 tablet 0   No current facility-administered medications on file prior to visit.    Objective:     There were no vitals filed for this visit.  Physical examination   Pelvic:   Vulva: Normal appearance.  No lesions.  Vagina: No lesions or abnormalities noted.  Support: Normal pelvic support.  Urethra No masses tenderness or scarring.  Meatus Normal size without lesions or prolapse.  Cervix: Normal appearance.  No lesions.  Anus: Normal exam.  No lesions.  Perineum: Normal exam.  No lesions.        Bimanual   Uterus: Normal size.  Non-tender.  Mobile.  AV.  Adnexae: No masses.  Non-tender to palpation.  Cul-de-sac: Negative for abnormality.   IUD Procedure Pt has read the booklet and signed the appropriate forms regarding the Mirena IUD.  All of her questions have been answered.   The cervix was cleansed with betadine solution.  After sounding the uterus and noting the position, the IUD was placed in the usual manner without problem.  The string was cut to the appropriate length.  The patient tolerated the procedure well.            NDC # = [id number]   Assessment:  [id number] Patient Active Problem List   Diagnosis Date Noted   History of gestational diabetes 01/16/2019   History of gestational hypertension 01/16/2019   Overweight 05/04/2017     1.  Encounter for insertion of intrauterine contraceptive device (IUD)       Plan:             F/U  Return in about 4 weeks (around 11/04/2020) for For IUD f/u.  Elonda Husky, M.D. 10/07/2020 1:30 PM

## 2020-11-03 ENCOUNTER — Encounter: Payer: BC Managed Care – PPO | Admitting: Obstetrics and Gynecology

## 2020-11-10 ENCOUNTER — Other Ambulatory Visit: Payer: Self-pay

## 2020-11-10 ENCOUNTER — Encounter: Payer: Self-pay | Admitting: Obstetrics and Gynecology

## 2020-11-10 ENCOUNTER — Ambulatory Visit (INDEPENDENT_AMBULATORY_CARE_PROVIDER_SITE_OTHER): Payer: BC Managed Care – PPO | Admitting: Obstetrics and Gynecology

## 2020-11-10 VITALS — BP 117/78 | HR 75 | Ht 66.0 in | Wt 183.2 lb

## 2020-11-10 DIAGNOSIS — Z30431 Encounter for routine checking of intrauterine contraceptive device: Secondary | ICD-10-CM | POA: Diagnosis not present

## 2020-11-10 NOTE — Progress Notes (Signed)
HPI:      [name] is a 35 y.o. G2P1101 who LMP was No LMP recorded. (Menstrual status: IUD).  Subjective:   She presents today for IUD follow-up.  Patient states that she had a week menstrual period but this has mostly resolved at this time.  She is not having any pelvic pain.  She has had intercourse without problem.    Hx: The following portions of the patient's history were reviewed and updated as appropriate:             She  has a past medical history of Gestational diabetes, HELLP syndrome, HPV in female, Preeclampsia (2012), and Scoliosis. She does not have any pertinent problems on file. She  has a past surgical history that includes Colposcopy. Her family history includes Breast cancer in her maternal aunt; Diabetes in her father, maternal aunt, maternal grandfather, maternal grandmother, mother, and paternal grandmother; Hypertension in her father, maternal grandfather, maternal grandmother, and mother. She  reports that she has never smoked. She has never used smokeless tobacco. She reports current alcohol use. She reports current drug use. Drug: Marijuana. She has a current medication list which includes the following prescription(s): mirena (52 mg) and multivitamin with minerals. She has No Known Allergies.       Review of Systems:  Review of Systems  Constitutional: Denied constitutional symptoms, night sweats, recent illness, fatigue, fever, insomnia and weight loss.  Eyes: Denied eye symptoms, eye pain, photophobia, vision change and visual disturbance.  Ears/Nose/Throat/Neck: Denied ear, nose, throat or neck symptoms, hearing loss, nasal discharge, sinus congestion and sore throat.  Cardiovascular: Denied cardiovascular symptoms, arrhythmia, chest pain/pressure, edema, exercise intolerance, orthopnea and palpitations.  Respiratory: Denied pulmonary symptoms, asthma, pleuritic pain, productive sputum, cough, dyspnea and wheezing.  Gastrointestinal: Denied,  gastro-esophageal reflux, melena, nausea and vomiting.  Genitourinary: Denied genitourinary symptoms including symptomatic vaginal discharge, pelvic relaxation issues, and urinary complaints.  Musculoskeletal: Denied musculoskeletal symptoms, stiffness, swelling, muscle weakness and myalgia.  Dermatologic: Denied dermatology symptoms, rash and scar.  Neurologic: Denied neurology symptoms, dizziness, headache, neck pain and syncope.  Psychiatric: Denied psychiatric symptoms, anxiety and depression.  Endocrine: Denied endocrine symptoms including hot flashes and night sweats.   Meds:   Current Outpatient Medications on File Prior to Visit  Medication Sig Dispense Refill   levonorgestrel (MIRENA, 52 MG,) 20 MCG/DAY IUD 1 each by Intrauterine route once. Inserted on 10/07/2020     Multiple Vitamins-Minerals (MULTIVITAMIN WITH MINERALS) tablet Take 1 tablet by mouth daily. 100 tablet 0   No current facility-administered medications on file prior to visit.      Objective:     Vitals:   11/10/20 1044  BP: 117/78  Pulse: 75   Filed Weights   11/10/20 1044  Weight: 183 lb 3.2 oz (83.1 kg)              Physical examination   Pelvic:   Vulva: Normal appearance.  No lesions.  Vagina: No lesions or abnormalities noted.  Support: Normal pelvic support.  Urethra No masses tenderness or scarring.  Meatus Normal size without lesions or prolapse.  Cervix: Normal appearance.  No lesions. IUD strings noted just inside cervical os.  Anus: Normal exam.  No lesions.  Perineum: Normal exam.  No lesions.        Bimanual   Uterus: Normal size.  Non-tender.  Mobile.  AV.  Adnexae: No masses.  Non-tender to palpation.  Cul-de-sac: Negative for abnormality.  Assessment:    G2P1101 Patient Active Problem List   Diagnosis Date Noted   History of gestational diabetes 01/16/2019   History of gestational hypertension 01/16/2019   Overweight 05/04/2017     1. IUD check up         Plan:            1.  Patient doing well with IUD.  Expect no further bleeding or problem. Orders No orders of the defined types were placed in this encounter.   No orders of the defined types were placed in this encounter.     F/U  Return for Annual Physical.  Elonda Husky, M.D. 11/10/2020 11:29 AM

## 2020-11-10 NOTE — Progress Notes (Signed)
Pt present for IUD check up. Pt stated that her last cycle lasted for 7-10 days; light with mild cramping.

## 2020-12-15 ENCOUNTER — Other Ambulatory Visit: Payer: Self-pay

## 2020-12-15 ENCOUNTER — Ambulatory Visit: Payer: BC Managed Care – PPO

## 2020-12-15 DIAGNOSIS — Z23 Encounter for immunization: Secondary | ICD-10-CM

## 2021-01-05 ENCOUNTER — Encounter: Payer: Self-pay | Admitting: Medical

## 2021-01-05 ENCOUNTER — Other Ambulatory Visit: Payer: Self-pay

## 2021-01-05 ENCOUNTER — Ambulatory Visit: Payer: BC Managed Care – PPO | Admitting: Medical

## 2021-01-05 VITALS — BP 124/82 | HR 112 | Temp 98.5°F | Resp 18

## 2021-01-05 DIAGNOSIS — J111 Influenza due to unidentified influenza virus with other respiratory manifestations: Secondary | ICD-10-CM

## 2021-01-05 DIAGNOSIS — R6889 Other general symptoms and signs: Secondary | ICD-10-CM

## 2021-01-05 LAB — POCT INFLUENZA A/B
Influenza A, POC: POSITIVE — AB
Influenza B, POC: NEGATIVE

## 2021-01-05 MED ORDER — OSELTAMIVIR PHOSPHATE 75 MG PO CAPS: 75.0000 mg | ORAL_CAPSULE | Freq: Two times a day (BID) | ORAL | 0 refills | Status: DC

## 2021-01-05 NOTE — Progress Notes (Signed)
   Subjective:    Patient ID: Kathleen Leach, female    DOB: 12-04-1985, 35 y.o.   MRN: [id number]  HPI 35 yo female in non acute distress, presents today with cough , runny nose clear and congestion, ST and body aches thinks she may have the flu.started Monday night. Vaccinated with Flu vaccinated Boyfriend went to the ED yesterday and was diagnosed with the Flu. Blood pressure 124/82, pulse (!) 112, temperature 98.5 F (36.9 C), temperature source Tympanic, resp. rate 18, SpO2 99 %.   No Known Allergies  Review of Systems  Constitutional:  Positive for chills.  HENT:  Positive for postnasal drip, rhinorrhea, sinus pressure and sore throat. Negative for ear discharge, ear pain, nosebleeds and sinus pain.   Respiratory:  Positive for cough. Negative for shortness of breath.   Cardiovascular:  Positive for chest pain.  Gastrointestinal:  Negative for abdominal pain, diarrhea, nausea and vomiting.  Genitourinary:  Negative for difficulty urinating.  Musculoskeletal:  Positive for myalgias.  Skin:  Negative for color change.  Neurological:  Positive for headaches. Negative for dizziness, syncope and light-headedness.      Objective:   Physical Exam Vitals and nursing note reviewed.  Constitutional:      Appearance: Normal appearance.  HENT:     Head: Normocephalic and atraumatic.     Right Ear: Hearing, ear canal and external ear normal. There is impacted cerumen.     Left Ear: Hearing, ear canal and external ear normal. A middle ear effusion is present.     Mouth/Throat:     Mouth: Mucous membranes are moist.     Pharynx: Posterior oropharyngeal erythema present.  Eyes:     Extraocular Movements: Extraocular movements intact.     Conjunctiva/sclera: Conjunctivae normal.     Pupils: Pupils are equal, round, and reactive to light.  Cardiovascular:     Rate and Rhythm: Normal rate and regular rhythm.  Pulmonary:     Effort: Pulmonary effort is normal.     Breath sounds: Normal  breath sounds.  Musculoskeletal:        General: Normal range of motion.     Cervical back: Normal range of motion and neck supple.  Skin:    General: Skin is warm and dry.  Neurological:     General: No focal deficit present.     Mental Status: She is alert and oriented to person, place, and time. Mental status is at baseline.  Psychiatric:        Mood and Affect: Mood normal.        Behavior: Behavior normal.        Thought Content: Thought content normal.        Judgment: Judgment normal.         Flu A positive Results for orders placed or performed in visit on 01/05/21 (from the past 24 hour(s))  POCT Influenza A/B     Status: Abnormal   Collection Time: 01/05/21  8:19 AM  Result Value Ref Range   Influenza A, POC Positive (A) Negative   Influenza B, POC Negative Negative    Assessment & Plan:  Influenza A Meds ordered this encounter  Medications   oseltamivir (TAMIFLU) 75 MG capsule    Sig: Take 1 capsule (75 mg total) by mouth 2 (two) times daily.    Dispense:  10 capsule    Refill:  0   . Patient verbalizes understanding and has no questions at discharge.Marland Kitchen

## 2021-01-05 NOTE — Patient Instructions (Signed)
Oseltamivir Capsules What is this medication? OSELTAMIVIR (os el TAM i vir) prevents and treats infections caused by the flu virus (influenza). It works by slowing the spread of the flu virus in your body and reducing how long your symptoms last. It will not treat colds or infections caused by bacteria or other viruses. It will not replace the annual flu vaccine. This medicine may be used for other purposes; ask your health care provider or pharmacist if you have questions. COMMON BRAND NAME(S): Tamiflu What should I tell my care team before I take this medication? They need to know if you have any of the following conditions: Difficulty swallowing Kidney disease An unusual or allergic reaction to oseltamivir, other medications, foods, dyes, or preservatives Pregnant or trying to get pregnant Breast-feeding How should I use this medication? Take this medication by mouth with water. Take it as directed on the prescription label at the same time every day. You can take it with or without food. If it upsets your stomach, take it with food. Take all of this medication unless your care team tells you to stop it early. Keep taking it even if you think you are better. When taking whole capsules: Do not cut, crush or chew this medication. Swallow the capsules whole. When mixing capsule contents with liquid: Open the capsule and mix the contents in a small bowl with a small amount of a sweetened liquid such as chocolate syrup, corn syrup, caramel topping, or light Blessinger sugar (dissolved in water). Stir the mixture and take the dose right away after mixing. Talk to your care team about the use of this medication in children. While it may be prescribed for selected conditions, precautions do apply. Overdosage: If you think you have taken too much of this medicine contact a poison control center or emergency room at once. NOTE: This medicine is only for you. Do not share this medicine with others. What if I  miss a dose? If you miss a dose, take it as soon as you remember. If it is almost time for your next dose (within 2 hours), take only that dose. Do not take double or extra doses. What may interact with this medication? Intranasal influenza vaccine This list may not describe all possible interactions. Give your health care provider a list of all the medicines, herbs, non-prescription drugs, or dietary supplements you use. Also tell them if you smoke, drink alcohol, or use illegal drugs. Some items may interact with your medicine. What should I watch for while using this medication? Visit your care team for regular checks on your progress. Tell your care team if your symptoms do not start to get better or if they get worse. If you have the flu, you may be at an increased risk of developing seizures, confusion, or abnormal behavior. This occurs early in the illness, and more frequently in children and teens. These events are not common, but may result in accidental injury to the patient. Families and caregivers of patients should watch for signs of unusual behavior and contact a care team right away if the patient shows signs of unusual behavior. To treat the flu, start taking this medication within 2 days of getting flu symptoms. This medication is not a substitute for the flu shot. Talk to your care team each year about an annual flu shot. What side effects may I notice from receiving this medication? Side effects that you should report to your care team as soon as possible: Allergic reactions--skin  rash, itching, hives, swelling of the face, lips, tongue, or throat Confusion Hallucinations Redness, blistering, peeling, or loosening of the skin, including inside the mouth Seizures Tremors or shaking Trouble speaking Unusual changes in behavior Side effects that usually do not require medical attention (report to your care team if they continue or are bothersome): Headache Nausea Vomiting This  list may not describe all possible side effects. Call your doctor for medical advice about side effects. You may report side effects to FDA at 1-800-FDA-1088. Where should I keep my medication? Keep out of the reach of children and pets. Store at room temperature between 15 and 30 degrees C (59 and 86 degrees F). Throw away any unused medication after the expiration date. NOTE: This sheet is a summary. It may not cover all possible information. If you have questions about this medicine, talk to your doctor, pharmacist, or health care provider.  2022 Elsevier/Gold Standard (2020-10-19 00:00:00) Influenza, Adult Influenza is also called "the flu." It is an infection in the lungs, nose, and throat (respiratory tract). It spreads easily from person to person (is contagious). The flu causes symptoms that are like a cold, along with high fever and body aches. What are the causes? This condition is caused by the influenza virus. You can get the virus by: Breathing in droplets that are in the air after a person infected with the flu coughed or sneezed. Touching something that has the virus on it and then touching your mouth, nose, or eyes. What increases the risk? Certain things may make you more likely to get the flu. These include: Not washing your hands often. Having close contact with many people during cold and flu season. Touching your mouth, eyes, or nose without first washing your hands. Not getting a flu shot every year. You may have a higher risk for the flu, and serious problems, such as a lung infection (pneumonia), if you: Are older than 65. Are pregnant. Have a weakened disease-fighting system (immune system) because of a disease or because you are taking certain medicines. Have a long-term (chronic) condition, such as: Heart, kidney, or lung disease. Diabetes. Asthma. Have a liver disorder. Are very overweight (morbidly obese). Have anemia. What are the signs or  symptoms? Symptoms usually begin suddenly and last 4-14 days. They may include: Fever and chills. Headaches, body aches, or muscle aches. Sore throat. Cough. Runny or stuffy (congested) nose. Feeling discomfort in your chest. Not wanting to eat as much as normal. Feeling weak or tired. Feeling dizzy. Feeling sick to your stomach or throwing up. How is this treated? If the flu is found early, you can be treated with antiviral medicine. This can help to reduce how bad the illness is and how long it lasts. This may be given by mouth or through an IV tube. Taking care of yourself at home can help your symptoms get better. Your doctor may want you to: Take over-the-counter medicines. Drink plenty of fluids. The flu often goes away on its own. If you have very bad symptoms or other problems, you may be treated in a hospital. Follow these instructions at home:   Activity Rest as needed. Get plenty of sleep. Stay home from work or school as told by your doctor. Do not leave home until you do not have a fever for 24 hours without taking medicine. Leave home only to go to your doctor. Eating and drinking Take an ORS (oral rehydration solution). This is a drink that is sold at pharmacies  and stores. Drink enough fluid to keep your pee pale yellow. Drink clear fluids in small amounts as you are able. Clear fluids include: Water. Ice chips. Fruit juice mixed with water. Low-calorie sports drinks. Eat bland foods that are easy to digest. Eat small amounts as you are able. These foods include: Bananas. Applesauce. Rice. Lean meats. Toast. Crackers. Do not eat or drink: Fluids that have a lot of sugar or caffeine. Alcohol. Spicy or fatty foods. General instructions Take over-the-counter and prescription medicines only as told by your doctor. Use a cool mist humidifier to add moisture to the air in your home. This can make it easier for you to breathe. When using a cool mist humidifier,  clean it daily. Empty water and replace with clean water. Cover your mouth and nose when you cough or sneeze. Wash your hands with soap and water often and for at least 20 seconds. This is also important after you cough or sneeze. If you cannot use soap and water, use alcohol-based hand sanitizer. Keep all follow-up visits. How is this prevented?  Get a flu shot every year. You may get the flu shot in late summer, fall, or winter. Ask your doctor when you should get your flu shot. Avoid contact with people who are sick during fall and winter. This is cold and flu season. Contact a doctor if: You get new symptoms. You have: Chest pain. Watery poop (diarrhea). A fever. Your cough gets worse. You start to have more mucus. You feel sick to your stomach. You throw up. Get help right away if you: Have shortness of breath. Have trouble breathing. Have skin or nails that turn a bluish color. Have very bad pain or stiffness in your neck. Get a sudden headache. Get sudden pain in your face or ear. Cannot eat or drink without throwing up. These symptoms may represent a serious problem that is an emergency. Get medical help right away. Call your local emergency services (911 in the [place] Do not wait to see if the symptoms will go away. Do not drive yourself to the hospital. Summary Influenza is also called "the flu." It is an infection in the lungs, nose, and throat. It spreads easily from person to person. Take over-the-counter and prescription medicines only as told by your doctor. Getting a flu shot every year is the best way to not get the flu. This information is not intended to replace advice given to you by your health care provider. Make sure you discuss any questions you have with your health care provider. Document Revised: 09/19/2019 Document Reviewed: 09/19/2019 Elsevier Patient Education  2022 [employer].

## 2021-02-17 ENCOUNTER — Telehealth: Payer: Self-pay | Admitting: Medical

## 2021-09-08 ENCOUNTER — Encounter: Payer: BC Managed Care – PPO | Admitting: Obstetrics and Gynecology

## 2021-09-12 ENCOUNTER — Encounter: Payer: BC Managed Care – PPO | Admitting: Obstetrics and Gynecology

## 2022-07-19 DIAGNOSIS — K029 Dental caries, unspecified: Secondary | ICD-10-CM | POA: Diagnosis not present

## 2022-07-19 DIAGNOSIS — S025XXA Fracture of tooth (traumatic), initial encounter for closed fracture: Secondary | ICD-10-CM | POA: Diagnosis not present

## 2022-07-19 DIAGNOSIS — X58XXXA Exposure to other specified factors, initial encounter: Secondary | ICD-10-CM | POA: Diagnosis not present

## 2022-07-19 DIAGNOSIS — K0889 Other specified disorders of teeth and supporting structures: Secondary | ICD-10-CM | POA: Diagnosis not present

## 2022-07-20 ENCOUNTER — Encounter: Payer: Self-pay | Admitting: Emergency Medicine

## 2022-07-20 ENCOUNTER — Other Ambulatory Visit: Payer: Self-pay

## 2022-07-20 ENCOUNTER — Emergency Department
Admission: EM | Admit: 2022-07-20 | Discharge: 2022-07-20 | Disposition: A | Discharge status: Home / Self Care | Payer: 59 | Attending: Emergency Medicine | Admitting: Emergency Medicine

## 2022-07-20 DIAGNOSIS — S025XXA Fracture of tooth (traumatic), initial encounter for closed fracture: Secondary | ICD-10-CM

## 2022-07-20 DIAGNOSIS — K029 Dental caries, unspecified: Secondary | ICD-10-CM

## 2022-07-20 DIAGNOSIS — K0889 Other specified disorders of teeth and supporting structures: Secondary | ICD-10-CM

## 2022-07-20 MED ORDER — AMOXICILLIN 500 MG PO CAPS: 500.0000 mg | ORAL_CAPSULE | Freq: Three times a day (TID) | ORAL | 0 refills | Status: DC

## 2022-07-20 MED ORDER — LIDOCAINE VISCOUS HCL 2 % MT SOLN
15.0000 mL | Freq: Once | OROMUCOSAL | Status: AC
  Administered 2022-07-20: 15 mL via OROMUCOSAL
  Filled 2022-07-20: qty 15

## 2022-07-20 MED ORDER — IBUPROFEN 800 MG PO TABS: 800.0000 mg | ORAL_TABLET | Freq: Three times a day (TID) | ORAL | 0 refills | Status: DC | PRN

## 2022-07-20 MED ORDER — AMOXICILLIN 500 MG PO CAPS
500.0000 mg | ORAL_CAPSULE | Freq: Once | ORAL | Status: AC
  Administered 2022-07-20: 500 mg via ORAL
  Filled 2022-07-20: qty 1

## 2022-07-20 MED ORDER — IBUPROFEN 800 MG PO TABS
800.0000 mg | ORAL_TABLET | Freq: Once | ORAL | Status: AC
  Administered 2022-07-20: 800 mg via ORAL
  Filled 2022-07-20: qty 1

## 2022-07-20 MED ORDER — HYDROCODONE-ACETAMINOPHEN 5-325 MG PO TABS: 1.0000 | ORAL_TABLET | Freq: Four times a day (QID) | ORAL | 0 refills | Status: DC | PRN

## 2022-07-20 NOTE — ED Provider Notes (Signed)
[facility] Provider Note    Event Date/Time   First MD Initiated Contact with Patient 07/20/22 0244     (approximate)   History   Dental Pain   HPI  Kathleen Leach is a 37 y.o. female who presents to the ED from home with a chief complaint of dental pain.  Patient reports left lower molar pain for several days.  Taking ibuprofen and BC powder without relief of symptoms.  Denies associated fever/chills, facial swelling, nausea/vomiting or dizziness.     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 Authorizing Provider  amoxicillin (AMOXIL) 500 MG capsule Take 1 capsule (500 mg total) by mouth 3 (three) times daily. 07/20/22  Yes Irean Hong, MD  HYDROcodone-acetaminophen (NORCO) 5-325 MG tablet Take 1 tablet by mouth every 6 (six) hours as needed for moderate pain. 07/20/22  Yes Irean Hong, MD  ibuprofen (ADVIL) 800 MG tablet Take 1 tablet (800 mg total) by mouth every 8 (eight) hours as needed for moderate pain. 07/20/22  Yes Irean Hong, MD  levonorgestrel (MIRENA, 52 MG,) 20 MCG/DAY IUD 1 each by Intrauterine route once. Inserted on 10/07/2020    [provider]  Multiple Vitamins-Minerals (MULTIVITAMIN WITH MINERALS) tablet Take 1 tablet by mouth daily. 09/03/18   Larene Pickett, FNP  oseltamivir (TAMIFLU) 75 MG capsule Take 1 capsule (75 mg total) by mouth 2 (two) times daily. 01/05/21   Doy Mince, PA-C      Allergies  Patient has no known allergies.   Family History   Family History  Problem Relation Age of Onset   Diabetes Mother    Hypertension Mother    Breast cancer Maternal Aunt    Diabetes Maternal Aunt    Diabetes Father    Hypertension Father    Diabetes Maternal Grandmother    Hypertension Maternal Grandmother    Diabetes Maternal Grandfather    Hypertension Maternal Grandfather    Diabetes Paternal Grandmother      Physical Exam  Triage Vital Signs: ED Triage Vitals  Enc Vitals Group     BP 07/19/22 2353 128/80     Pulse Rate 07/19/22 2353 67     Resp 07/19/22 2353 20     Temp 07/19/22 2353 98.4 F (36.9 C)     Temp src --      SpO2 07/19/22 2353 100 %     Weight 07/20/22 0001 183 lb (83 kg)     Height 07/20/22 0001 5\' 6"  (1.676 m)     Head Circumference --      Peak Flow --      Pain Score 07/20/22 0001 8     Pain Loc --      Pain Edu? --      Excl. in GC? --     Updated Vital Signs: BP 128/80 (  BP Location: Left Arm)   Pulse 67   Temp 98.4 F (36.9 C)   Resp 20   Ht 5\' 6"  (1.676 m)   Wt 83 kg   LMP 06/14/2022 (Approximate)   SpO2 100%   BMI 29.54 kg/m    General: Awake, no distress.  CV:  Good peripheral perfusion.  Resp:  Normal effort.  Abd:  No distention.  Other:  Multiple dental caries noted.  Chipped/broken left posterior molar which is tender to palpation with tongue blade.  No intraoral/extraoral swelling.   ED Results / Procedures / Treatments  Labs (all labs ordered are listed, but only abnormal results are displayed) Labs Reviewed - No data to display   EKG  None   RADIOLOGY None   Official radiology report(s): No results found.   PROCEDURES:  Critical Care performed: No  Procedures   MEDICATIONS ORDERED IN ED: Medications  amoxicillin (AMOXIL) capsule 500 mg (has no administration in time range)  lidocaine (XYLOCAINE) 2 % viscous mouth solution 15 mL (has no administration in time range)   ibuprofen (ADVIL) tablet 800 mg (has no administration in time range)     IMPRESSION / MDM / ASSESSMENT AND PLAN / ED COURSE  I reviewed the triage vital signs and the nursing notes.                             37 year old female presenting with dentalgia.  Will start amoxicillin, ibuprofen and lidocaine rinse in the ED, limited prescription Norco on discharge.  Will provide list of dental clinics to follow-up with.  Strict return precautions given.  Patient verbalizes understanding and agrees with plan of care.  Patient's presentation is most consistent with acute, uncomplicated illness.   FINAL CLINICAL IMPRESSION(S) / ED DIAGNOSES   Final diagnoses:  Pain, dental  Closed fracture of tooth, initial encounter  Dental caries     Rx / DC Orders   ED Discharge Orders          Ordered    amoxicillin (AMOXIL) 500 MG capsule  3 times daily        07/20/22 0255    ibuprofen (ADVIL) 800 MG tablet  Every 8 hours PRN        07/20/22 0255    HYDROcodone-acetaminophen (NORCO) 5-325 MG tablet  Every 6 hours PRN        07/20/22 0255             Note:  This document was prepared using Dragon voice recognition software and may include unintentional dictation errors.   Irean Hong, MD 07/20/22 [phone]

## 2022-07-20 NOTE — ED Triage Notes (Signed)
Patient ambulatory to triage with steady gait, without difficulty or distress noted; pt reports left sided dental pain for several days; taking ibuprofen and BC without relief

## 2022-07-20 NOTE — Discharge Instructions (Signed)
1. Take antibiotic as prescribed (Amoxicillin 500mg  three times daily x 7 days). 2. Take pain medicines as needed (Motrin/Norco #15). 3. Return to the ER for worsening symptoms, persistent vomiting, fever, difficulty breathing or other concerns.  OPTIONS FOR DENTAL FOLLOW UP CARE  [place] Department of Health and Human Services - Local Safety Net Dental Clinics [email or web address]   [facility] [phone])  [facility] [phone])  [place] [phone])  [facility] [phone])  [facility] [phone]) This clinic caters to the indigent population and is on a lottery system. Location: [employer] [facility], [employer], [address], [place] Clinic Hours: Wednesdays from 6pm - 9pm, patients seen by a lottery system. For dates, call or go to [email or web address] Services: Cleanings, fillings and simple extractions. Payment Options: DENTAL WORK IS FREE OF CHARGE. Bring proof of income or support. Best way to get seen: Arrive at 5:15 pm - this is a lottery, NOT first come/first serve, so arriving earlier will not increase your chances of being seen.     [facility] [phone] Select option 1 for emergencies   Location: [facility], [place], [address] [place] Clinic Hours: No walk-ins accepted - call the day before to schedule an appointment. Check in times are 9:30 am and 1:30 pm. Services: Simple extractions, temporary fillings, pulpectomy/pulp debridement, uncomplicated abscess drainage. Payment Options: PAYMENT IS DUE AT THE TIME OF SERVICE.  Fee is usually $100-200, additional surgical procedures (e.g. abscess drainage) may be extra. Cash, checks, Visa/MasterCard accepted.  Can file Medicaid if patient is covered for dental - patient should call case worker  to check. No discount for [facility] patients. Best way to get seen: MUST call the day before and get onto the schedule. Can usually be seen the next 1-2 days. No walk-ins accepted.     [facility] [phone]   Location: [facility], [address] [place] Clinic Hours: M, W, Th, F 8am or 1:30pm, Tues 9a or 1:30 - first come/first served. Services: Simple extractions, temporary fillings, uncomplicated abscess drainage.  You do not need to be an [facility] resident. Payment Options: PAYMENT IS DUE AT THE TIME OF SERVICE. Dental insurance, otherwise sliding scale - bring proof of income or support. Depending on income and treatment needed, cost is usually $50-200. Best way to get seen: Arrive early as it is first come/first served.     [facility] [phone]   Location: [address] Clinic Hours: Mon-Thu 8a-5p Services: Most basic dental services including extractions and fillings. Payment Options: PAYMENT IS DUE AT THE TIME OF SERVICE. Sliding scale, up to 50% off - bring proof if income or support. Medicaid with dental option accepted. Best way to get seen: Call to schedule an appointment, can usually be seen within 2 weeks OR they will try to see walk-ins - show up at 8a or 2p (you may have to wait).     [facility] [phone] [place] RESIDENTS ONLY   Location: [facility], [address] [place], [place] [address] Clinic Hours: By appointment only. Monday - Thursday 8am-5pm, Friday 8am-12pm Services: Cleanings, fillings, extractions. Payment Options: PAYMENT IS DUE AT THE TIME OF SERVICE. Cash, Visa or MasterCard. Sliding scale - $30 minimum per service. Best way to get seen: Come in to office, complete packet and make an appointment - need proof of  income or support monies for each household member and proof of [facility] residence. Usually takes about a month to get in.     [facility] [phone]   Location: [address] [facility] Clinic Hours: Walk-in Urgent Care Dental Services are offered Monday-Friday mornings only. The numbers of emergencies accepted daily is limited to the number of providers available. Maximum 15 - Mondays, Wednesdays & Thursdays Maximum 10 - Tuesdays & Fridays Services: You do not need to be a [facility] resident to be seen for a dental emergency. Emergencies are defined as pain, swelling, abnormal bleeding, or dental trauma. Walkins will receive x-rays if needed. NOTE: Dental cleaning is not an emergency. Payment Options: PAYMENT IS DUE AT THE TIME OF SERVICE. Minimum co-pay is $40.00 for uninsured patients. Minimum co-pay is $3.00 for Medicaid with dental coverage. Dental Insurance is accepted and must be presented at time of visit. Medicare does not cover dental. Forms of payment: Cash, credit card, checks. Best way to get seen: If not previously registered with the clinic, walk-in dental registration begins at 7:15 am and is on a first come/first serve basis. If previously registered with the clinic, call to make an appointment.     [facility] [phone] [place] RESIDENTS ONLY   Location: [address] [place], [place] Clinic Hours: Mon-Thu 10a-2p Services: Extractions only! Payment Options: FREE (donations accepted) - bring proof of income or support Best way to get seen: Call and schedule an appointment OR come at 8am on the 1st Monday of every month (except for holidays) when it is first come/first served.     [facility] [phone]   Location: [address] [place], [place] Clinic Hours: Friday mornings Services, Payment Options, Best way to get seen: Call for info

## 2022-09-08 ENCOUNTER — Emergency Department
Admission: EM | Admit: 2022-09-08 | Discharge: 2022-09-08 | Disposition: A | Discharge status: Home / Self Care | Payer: 59 | Attending: Student in an Organized Health Care Education/Training Program | Admitting: Student in an Organized Health Care Education/Training Program

## 2022-09-08 ENCOUNTER — Encounter: Payer: Self-pay | Admitting: Emergency Medicine

## 2022-09-08 ENCOUNTER — Other Ambulatory Visit: Payer: Self-pay

## 2022-09-08 DIAGNOSIS — K029 Dental caries, unspecified: Secondary | ICD-10-CM | POA: Insufficient documentation

## 2022-09-08 DIAGNOSIS — K0889 Other specified disorders of teeth and supporting structures: Secondary | ICD-10-CM | POA: Diagnosis not present

## 2022-09-08 MED ORDER — AMOXICILLIN-POT CLAVULANATE 875-125 MG PO TABS: 1.0000 | ORAL_TABLET | Freq: Two times a day (BID) | ORAL | 0 refills | Status: AC

## 2022-09-08 MED ORDER — CHLORHEXIDINE GLUCONATE 0.12 % MT SOLN: 15.0000 mL | Freq: Two times a day (BID) | OROMUCOSAL | 0 refills | Status: DC

## 2022-09-08 NOTE — ED Triage Notes (Signed)
Pt to ED via POV c/o dental pain since last night. Pt denies swelling.

## 2022-09-08 NOTE — Discharge Instructions (Addendum)
Please follow-up with a dentist soon as possible.  You may take the antibiotics as prescribed.  Please return for any new, worsening, or change in symptoms or other concerns.  It was a pleasure caring for you today.

## 2022-09-08 NOTE — ED Provider Notes (Signed)
[facility] Provider Note    Event Date/Time   First MD Initiated Contact with Patient 09/08/22 [phone]     (approximate)   History   Dental Pain   HPI  Kathleen Leach is a 37 y.o. female who presents today for evaluation of dental pain that began last night.  Patient reports that she has a known problematic tooth but has been unable to see a dentist due to financial reasons.  However, she has plans to see 1 this month.  She reports that her pain worsened last night.  She has not had any swelling.  She has not had any difficulty eating or drinking.  She has not had any trouble swallowing.  No fevers or chills.  Patient Active Problem List   Diagnosis Date Noted   History of gestational diabetes 01/16/2019   History of gestational hypertension 01/16/2019   Overweight 05/04/2017          Physical Exam   Triage Vital Signs: ED Triage Vitals  Encounter Vitals Group     BP --      Systolic BP Percentile --      Diastolic BP Percentile --      Pulse --      Resp --      Temp --      Temp src --      SpO2 --      Weight 09/08/22 0914 182 lb 15.7 oz (83 kg)     Height 09/08/22 0914 5\' 6"  (1.676 m)     Head Circumference --      Peak Flow --      Pain Score 09/08/22 0913 10     Pain Loc --      Pain Education --      Exclude from Growth Chart --     Most recent vital signs: Vitals:   09/08/22 0919  BP: 123/81  Pulse: 98  Resp: 16  Temp: 97.7 F (36.5 C)  SpO2: 100%    Physical Exam Vitals and nursing note reviewed.  Constitutional:      General: Awake and alert. No acute distress.    Appearance: Normal appearance. The patient is normal weight.  HENT:     Head: Normocephalic and atraumatic.     Mouth: Mucous membranes are moist.  Tooth #17 with obvious decay and dental caries.  No gingival fluctuance or tenderness.  No sublingual swelling.  No facial or neck swelling or erythema. Eyes:     General: PERRL. Normal EOMs        Right  eye: No discharge.        Left eye: No discharge.     Conjunctiva/sclera: Conjunctivae normal.  Cardiovascular:     Rate and Rhythm: Normal rate and regular rhythm.     Pulses: Normal pulses.  Pulmonary:     Effort: Pulmonary effort is normal. No respiratory distress.     Breath sounds: Normal breath sounds.  Abdominal:     Abdomen is soft. There is no abdominal tenderness. No rebound or guarding. No distention. Musculoskeletal:        General: No swelling. Normal range of motion.     Cervical back: Normal range of motion and neck supple.  Skin:    General: Skin is warm and dry.     Capillary Refill: Capillary refill takes less than 2 seconds.     Findings: No rash.  Neurological:     Mental Status: The patient is  awake and alert.      ED Results / Procedures / Treatments   Labs (all labs ordered are listed, but only abnormal results are displayed) Labs Reviewed - No data to display   EKG     RADIOLOGY     PROCEDURES:  Critical Care performed:   Procedures   MEDICATIONS ORDERED IN ED: Medications - No data to display   IMPRESSION / MDM / ASSESSMENT AND PLAN / ED COURSE  I reviewed the triage vital signs and the nursing notes.   Differential diagnosis includes, but is not limited to, dental decay, dental caries, pulpitis.  Patient is awake and alert, hemodynamically stable and afebrile. Patient has tenderness to tooth #17 with obvious dental caries and decay, I suspect some dental caries vs decay vs pulpitits. No gingival swelling or fluctuance concerning for gingival abscess.  No trismus, nuchal rigidity, neck pain, hot potato voice, uvular deviation or malocclusion to suggest deep space infection. No sublingual swelling concerning for Ludwig's angina.  Patient was started on antibiotics and chlorhexidine mouth rinse.  Patient was treated symptomatically in the emergency department. Discussed care plan, return precautions, and advised close outpatient  follow-up with dentist. Patient agrees with plan of care.   Patient's presentation is most consistent with acute complicated illness / injury requiring diagnostic workup.    FINAL CLINICAL IMPRESSION(S) / ED DIAGNOSES   Final diagnoses:  Pain due to dental caries     Rx / DC Orders   ED Discharge Orders          Ordered    amoxicillin-clavulanate (AUGMENTIN) 875-125 MG tablet  2 times daily        09/08/22 0943    chlorhexidine (PERIDEX) 0.12 % solution  2 times daily        09/08/22 1610             Note:  This document was prepared using Dragon voice recognition software and may include unintentional dictation errors.   Jackelyn Hoehn, PA-C 09/08/22 1500    Willy Eddy, MD 09/08/22 1540

## 2022-10-02 ENCOUNTER — Other Ambulatory Visit: Payer: Self-pay

## 2022-10-02 ENCOUNTER — Emergency Department
Admission: EM | Admit: 2022-10-02 | Discharge: 2022-10-02 | Disposition: A | Discharge status: Home / Self Care | Payer: 59 | Attending: Emergency Medicine | Admitting: Emergency Medicine

## 2022-10-02 DIAGNOSIS — K047 Periapical abscess without sinus: Secondary | ICD-10-CM | POA: Insufficient documentation

## 2022-10-02 DIAGNOSIS — K0889 Other specified disorders of teeth and supporting structures: Secondary | ICD-10-CM | POA: Diagnosis not present

## 2022-10-02 MED ORDER — TRAMADOL HCL 50 MG PO TABS: 50.0000 mg | ORAL_TABLET | Freq: Four times a day (QID) | ORAL | 0 refills | Status: DC | PRN

## 2022-10-02 MED ORDER — AMOXICILLIN 875 MG PO TABS: 875.0000 mg | ORAL_TABLET | Freq: Two times a day (BID) | ORAL | 0 refills | Status: DC

## 2022-10-02 NOTE — Discharge Instructions (Signed)
OPTIONS FOR DENTAL FOLLOW UP CARE ° °[place] Department of Health and Human Services - Local Safety Net Dental Clinics °http://www.ncdhhs.gov/dph/oralhealth/services/safetynetclinics.htm °  °[facility] ([phone]) ° °[facility] [place] ([phone]) ° °[facility] ([phone]) ° °[facility] ([phone]) ° °[facility] ([phone]) °This clinic caters to the indigent population and is on a lottery system. °Location: °[facility], [place], [address], [place] °Clinic Hours: °Wednesdays from 6pm - 9pm, patients seen by a lottery system. °For dates, call or go to [email or web address]/[facility]/patients/[email or web address] °Services: °Cleanings, fillings and simple extractions. °Payment Options: °DENTAL WORK IS FREE OF CHARGE. Bring proof of income or support. °Best way to get seen: °Arrive at 5:15 pm - this is a lottery, NOT first come/first serve, so arriving earlier will not increase your chances of being seen. °  °  °[facility] °[phone] °Select option 1 for emergencies °  °Location: °[facility], [place], [address], [place] °Clinic Hours: °No walk-ins accepted - call the day before to schedule an appointment. °Check in times are 9:30 am and 1:30 pm. °Services: °Simple extractions, temporary fillings, pulpectomy/pulp debridement, uncomplicated abscess drainage. °Payment Options: °PAYMENT IS DUE AT THE TIME OF SERVICE.  Fee is usually $100-200, additional surgical procedures (e.g. abscess drainage) may be extra. °Cash, checks, Visa/MasterCard accepted.  Can file Medicaid if patient is covered for dental - patient should call case worker to check. °No discount for [facility] patients. °Best way to get seen: °MUST call the day before and get onto the schedule. Can usually be seen the next 1-2 days. No walk-ins accepted. °  °  °[facility] °[phone] °   °Location: °[facility], [address], [place] °Clinic Hours: °M, W, Th, F 8am or 1:30pm, Tues 9a or 1:30 - first come/first served. °Services: °Simple extractions, temporary fillings, uncomplicated abscess drainage.  You do not need to be an [place] resident. °Payment Options: °PAYMENT IS DUE AT THE TIME OF SERVICE. °Dental insurance, otherwise sliding scale - bring proof of income or support. °Depending on income and treatment needed, cost is usually $50-200. °Best way to get seen: °Arrive early as it is first come/first served. °  °  °[facility] °[phone] °  °Location: °[address] °Clinic Hours: °Mon-Thu 8a-5p °Services: °Most basic dental services including extractions and fillings. °Payment Options: °PAYMENT IS DUE AT THE TIME OF SERVICE. °Sliding scale, up to 50% off - bring proof if income or support. °Medicaid with dental option accepted. °Best way to get seen: °Call to schedule an appointment, can usually be seen within 2 weeks OR they will try to see walk-ins - show up at 8a or 2p (you may have to wait). °  °  °[facility] °[phone] °[place] RESIDENTS ONLY °  °Location: °[facility], [address], [place], [place] [address] °Clinic Hours: By appointment only. °Monday - Thursday 8am-5pm, Friday 8am-12pm °Services: Cleanings, fillings, extractions. °Payment Options: °PAYMENT IS DUE AT THE TIME OF SERVICE. °Cash, Visa or MasterCard. Sliding scale - $30 minimum per service. °Best way to get seen: °Come in to office, complete packet and make an appointment - need proof of income °or support monies for each household member and proof of [place] residence. °Usually takes about a month to get in. °  °  °[facility] °[phone] °  °Location: °[address]   [place] °Clinic Hours: Walk-in Urgent Care Dental Services are offered Monday-Friday  mornings only. °The numbers of emergencies accepted daily is limited to the number of °providers available. °Maximum 15 - Mondays, Wednesdays & Thursdays °Maximum 10 - Tuesdays & Fridays °Services: °You do not need to be a [place] resident to be seen for a dental emergency. °Emergencies are defined as pain, swelling, abnormal bleeding, or dental trauma. Walkins will receive x-rays if needed. °NOTE: Dental cleaning is not an emergency. °Payment Options: °PAYMENT IS DUE AT THE TIME OF SERVICE. °Minimum co-pay is $40.00 for uninsured patients. °Minimum co-pay is $3.00 for Medicaid with dental coverage. °Dental Insurance is accepted and must be presented at time of visit. °Medicare does not cover dental. °Forms of payment: Cash, credit card, checks. °Best way to get seen: °If not previously registered with the clinic, walk-in dental registration begins at 7:15 am and is on a first come/first serve basis. °If previously registered with the clinic, call to make an appointment. °  °  °[facility] °[phone] °[place] RESIDENTS ONLY °  °Location: °[address], [place], [place] °Clinic Hours: °Mon-Thu 10a-2p °Services: Extractions only! °Payment Options: °FREE (donations accepted) - bring proof of income or support °Best way to get seen: °Call and schedule an appointment OR come at 8am on the 1st Monday of every month (except for holidays) when it is first come/first served. °  °  °[facility] °[phone] °  °Location: °[address], [place] °Clinic Hours: °Friday mornings °Services, Payment Options, Best way to get seen: °Call for info °

## 2022-10-02 NOTE — ED Provider Notes (Signed)
[facility] Provider Note    Event Date/Time   First MD Initiated Contact with Patient 10/02/22 [phone]     (approximate)   History   Dental Pain   HPI  Kathleen Leach is a 37 y.o. female with no significant past medical history presents emergency permit complaining dental pain.  Patient has a history of the same.  States the left lower molar has had a cavity and is very painful.  Has not followed up with a dentist due to financial reasons.  Denies chest pain, shortness of breath, fever or chills.      Physical Exam   Triage Vital Signs: ED Triage Vitals  Encounter Vitals Group     BP 10/02/22 0426 (!) 146/99     Systolic BP Percentile --      Diastolic BP Percentile --      Pulse Rate 10/02/22 0426 82     Resp 10/02/22 0426 16     Temp 10/02/22 0426 98.2 F (36.8 C)     Temp Source 10/02/22 0426 Oral     SpO2 10/02/22 0426 100 %     Weight 10/02/22 0427 182 lb 15.7 oz (83 kg)     Height 10/02/22 0427 5\' 6"  (1.676 m)     Head Circumference --      Peak Flow --      Pain Score 10/02/22 0427 10     Pain Loc --      Pain Education --      Exclude from Growth Chart --     Most recent vital signs: Vitals:   10/02/22 0428 10/02/22 0727  BP:  (!) 126/91  Pulse:  98  Resp:  14  Temp:  97.9 F (36.6 C)  SpO2: 98% 98%     General: Awake, no distress.   CV:  Good peripheral perfusion. regular rate and  rhythm Resp:  Normal effort.  Abd:  No distention.   Other:  Left lower molar decayed, neck is supple, no lymphadenopathy, mandible is nontender   ED Results / Procedures / Treatments   Labs (all labs ordered are listed, but only abnormal results are displayed) Labs Reviewed - No data to display   EKG     RADIOLOGY     PROCEDURES:   Procedures   MEDICATIONS ORDERED IN ED: Medications - No data to display   IMPRESSION / MDM / ASSESSMENT AND PLAN / ED COURSE  I reviewed the triage vital signs and the nursing notes.                               Differential diagnosis includes, but is not limited to, dental abscess, dental caries, dental pain  Patient's presentation is most consistent with acute, uncomplicated illness.   Patient's exam is consistent with dental abscess.  Patient does have a tooth that is broken off of the gumline.  I did inform her that this will not get better until she has the tooth either removed or repaired.  She was given a prescription for amoxicillin and tramadol.  Take over-the-counter Tylenol and ibuprofen prior to taking the tramadol.  Patient is in agreement treatment plan.  She was given a list of discounted dentist.  She is discharged stable condition.      FINAL CLINICAL IMPRESSION(S) / ED DIAGNOSES   Final diagnoses:  Pain, dental  Dental abscess     Rx / DC Orders  ED Discharge Orders          Ordered    amoxicillin (AMOXIL) 875 MG tablet  2 times daily        10/02/22 0728    traMADol (ULTRAM) 50 MG tablet  Every 6 hours PRN        10/02/22 1610             Note:  This document was prepared using Dragon voice recognition software and may include unintentional dictation errors.    Faythe Ghee, PA-C 10/02/22 9604    Jene Every, MD 10/02/22 [phone]

## 2022-10-02 NOTE — ED Triage Notes (Signed)
Pt states she has dental pain that she tried taking meds at home & gargling salt water with no relief. Denies injury

## 2022-10-12 ENCOUNTER — Ambulatory Visit: Payer: 59 | Admitting: Obstetrics and Gynecology

## 2022-10-21 ENCOUNTER — Encounter: Payer: Self-pay | Admitting: Emergency Medicine

## 2022-10-21 ENCOUNTER — Other Ambulatory Visit: Payer: Self-pay

## 2022-10-21 ENCOUNTER — Emergency Department
Admission: EM | Admit: 2022-10-21 | Discharge: 2022-10-21 | Disposition: A | Discharge status: Home / Self Care | Payer: 59 | Attending: Student in an Organized Health Care Education/Training Program | Admitting: Student in an Organized Health Care Education/Training Program

## 2022-10-21 DIAGNOSIS — K029 Dental caries, unspecified: Secondary | ICD-10-CM | POA: Diagnosis not present

## 2022-10-21 DIAGNOSIS — K0889 Other specified disorders of teeth and supporting structures: Secondary | ICD-10-CM | POA: Diagnosis not present

## 2022-10-21 MED ORDER — KETOROLAC TROMETHAMINE 15 MG/ML IJ SOLN
15.0000 mg | Freq: Once | INTRAMUSCULAR | Status: AC
  Administered 2022-10-21: 15 mg via INTRAMUSCULAR
  Filled 2022-10-21: qty 1

## 2022-10-21 MED ORDER — CHLORHEXIDINE GLUCONATE 0.12 % MT SOLN: 15.0000 mL | Freq: Two times a day (BID) | OROMUCOSAL | 0 refills | Status: DC

## 2022-10-21 MED ORDER — AMOXICILLIN-POT CLAVULANATE 875-125 MG PO TABS: 1.0000 | ORAL_TABLET | Freq: Two times a day (BID) | ORAL | 0 refills | Status: AC

## 2022-10-21 NOTE — Discharge Instructions (Addendum)
Please take the medications as prescribed.  Please follow-up with your dentist.  Please return for any new, worsening, or change in symptoms or other concerns.  It was a pleasure caring for you today.

## 2022-10-21 NOTE — ED Provider Notes (Signed)
[facility] Provider Note    Event Date/Time   First MD Initiated Contact with Patient 10/21/22 [phone]     (approximate)   History   Dental Pain   HPI  Kathleen Leach is a 37 y.o. female who presents today for evaluation of right lower tooth pain that she reports has been a problem for her for quite some time, though she started to worsen over the past couple of days.  She reports that it was better while she was taking amoxicillin last week.  She reports that her fianc started a new job and gets his first paycheck on 10/30/2022 and she reports that she is able to see a dentist at that time.  No fevers or chills.  No facial or neck swelling or redness.  She reports that she is able to swallow and open her mouth normally.  Patient Active Problem List   Diagnosis Date Noted   History of gestational diabetes 01/16/2019   History of gestational hypertension 01/16/2019   Overweight 05/04/2017          Physical Exam   Triage Vital Signs: ED Triage Vitals  Encounter Vitals Group     BP 10/21/22 0824 (!) 141/86     Systolic BP Percentile --      Diastolic BP Percentile --      Pulse Rate 10/21/22 0824 72     Resp 10/21/22 0824 18     Temp 10/21/22 0824 98.7 F (37.1 C)     Temp Source 10/21/22 0824 Oral     SpO2 10/21/22 0824 98 %     Weight 10/21/22 0823 183 lb (83 kg)     Height 10/21/22 0823 5\' 6"  (1.676 m)     Head Circumference --      Peak Flow --      Pain Score 10/21/22 0823 10     Pain Loc --      Pain Education --      Exclude from Growth Chart --     Most recent vital signs: Vitals:   10/21/22 0824  BP: (!) 141/86  Pulse: 72  Resp: 18  Temp: 98.7 F (37.1 C)  SpO2: 98%    Physical Exam Vitals and nursing note reviewed.  Constitutional:      General: Awake and alert. No acute distress.    Appearance: Normal appearance. The patient is normal weight.  HENT:     Head: Normocephalic and atraumatic.     Mouth: Mucous membranes  are moist.  Poor dentition, with large caries noted to her posterior left lower molar.  No gingival fluctuance or tenderness.  No dental loosening.  No sublingual swelling.  No facial or neck swelling or erythema. Eyes:     General: PERRL. Normal EOMs        Right eye: No discharge.        Left eye: No discharge.     Conjunctiva/sclera: Conjunctivae normal.  Cardiovascular:     Rate and Rhythm: Normal rate and regular rhythm.     Pulses: Normal pulses.  Pulmonary:     Effort: Pulmonary effort is normal. No respiratory distress.     Breath sounds: Normal breath sounds.  Abdominal:     Abdomen is soft. There is no abdominal tenderness. No rebound or guarding. No distention. Musculoskeletal:        General: No swelling. Normal range of motion.     Cervical back: Normal range of motion and neck supple.  Skin:    General: Skin is warm and dry.     Capillary Refill: Capillary refill takes less than 2 seconds.     Findings: No rash.  Neurological:     Mental Status: The patient is awake and alert.      ED Results / Procedures / Treatments   Labs (all labs ordered are listed, but only abnormal results are displayed) Labs Reviewed - No data to display   EKG     RADIOLOGY     PROCEDURES:  Critical Care performed:   Procedures   MEDICATIONS ORDERED IN ED: Medications  ketorolac (TORADOL) 15 MG/ML injection 15 mg (15 mg Intramuscular Given 10/21/22 0836)     IMPRESSION / MDM / ASSESSMENT AND PLAN / ED COURSE  I reviewed the triage vital signs and the nursing notes.   Differential diagnosis includes, but is not limited to, dental caries, pulpitis, abscess.  I reviewed the patient's chart.  Patient has been seen multiple times in the past for dental pain.  Patient has obvious dental carry to left lower molar.  I suspect some dental caries vs pulpitits. No gingival swelling or fluctuance concerning for gingival abscess.  No trismus, nuchal rigidity, neck pain, hot  potato voice, uvular deviation or malocclusion to suggest deep space infection. No sublingual swelling concerning for Ludwig's angina.  Patient was started on antibiotics and chlorhexidine mouth rinse.  Patient was treated symptomatically in the emergency department. Discussed care plan, return precautions, and advised close outpatient follow-up with dentist. Patient agrees with plan of care.    Patient's presentation is most consistent with exacerbation of chronic illness.   FINAL CLINICAL IMPRESSION(S) / ED DIAGNOSES   Final diagnoses:  Dental caries  Pain due to dental caries     Rx / DC Orders   ED Discharge Orders          Ordered    amoxicillin-clavulanate (AUGMENTIN) 875-125 MG tablet  2 times daily        10/21/22 0831    chlorhexidine (PERIDEX) 0.12 % solution  2 times daily        10/21/22 0831             Note:  This document was prepared using Dragon voice recognition software and may include unintentional dictation errors.   Jackelyn Hoehn, PA-C 10/21/22 1231    Willy Eddy, MD 10/21/22 1345

## 2022-10-21 NOTE — ED Triage Notes (Signed)
Pt via POV from home. Pt c/o L lower abd pain that started last night, states that she knows that she has tooth that needs to be pulled. Denies fever. No swelling noted at this time. Pt is A&Ox4 and NAD

## 2022-10-23 NOTE — Group Note (Deleted)

## 2022-11-05 DIAGNOSIS — K0889 Other specified disorders of teeth and supporting structures: Secondary | ICD-10-CM | POA: Diagnosis not present

## 2022-11-05 DIAGNOSIS — K036 Deposits [accretions] on teeth: Secondary | ICD-10-CM | POA: Diagnosis not present

## 2022-11-05 DIAGNOSIS — F17210 Nicotine dependence, cigarettes, uncomplicated: Secondary | ICD-10-CM | POA: Diagnosis not present

## 2022-11-05 DIAGNOSIS — K056 Periodontal disease, unspecified: Secondary | ICD-10-CM | POA: Diagnosis not present

## 2022-11-05 DIAGNOSIS — Z79899 Other long term (current) drug therapy: Secondary | ICD-10-CM | POA: Diagnosis not present

## 2022-11-24 ENCOUNTER — Ambulatory Visit
Admission: EM | Admit: 2022-11-24 | Discharge: 2022-11-24 | Disposition: A | Discharge status: Home / Self Care | Payer: 59 | Attending: Family Medicine | Admitting: Family Medicine

## 2022-11-24 DIAGNOSIS — K047 Periapical abscess without sinus: Secondary | ICD-10-CM | POA: Diagnosis not present

## 2022-11-24 MED ORDER — AMOXICILLIN-POT CLAVULANATE 875-125 MG PO TABS: 1.0000 | ORAL_TABLET | Freq: Two times a day (BID) | ORAL | 0 refills | Status: AC

## 2022-11-24 MED ORDER — KETOROLAC TROMETHAMINE 30 MG/ML IJ SOLN
30.0000 mg | Freq: Once | INTRAMUSCULAR | Status: AC
  Administered 2022-11-24: 30 mg via INTRAMUSCULAR

## 2022-11-24 MED ORDER — KETOROLAC TROMETHAMINE 10 MG PO TABS: 10.0000 mg | ORAL_TABLET | Freq: Four times a day (QID) | ORAL | 0 refills | Status: DC | PRN

## 2022-11-24 NOTE — ED Triage Notes (Signed)
Patient presents to UC for on-going left lower dental pain x 2 months. States she has been treating pain with ibuprofen, last dose of 200 mg at 0300.

## 2022-11-24 NOTE — Discharge Instructions (Addendum)
You have been given a shot of Toradol 30 mg today.  Ketorolac 10 mg tablets--take 1 tablet every 6 hours as needed for pain.  This is the same medicine that is in the shot we just gave you  Take amoxicillin-clavulanate 875 mg--1 tab twice daily with food for 7 days  Try calling [facility] at [phone] to see if you can have a lower cost dental care there.

## 2022-11-24 NOTE — ED Provider Notes (Addendum)
Renaldo Fiddler    CSN: [id number] Arrival date & time: 11/24/22  7829      History   Chief Complaint Chief Complaint  Patient presents with   Dental Pain    HPI Kathleen Leach is a 37 y.o. female.    Dental Pain Since yesterday she has been having worsened left jaw pain.   Has had trouble with this pain off and on for about 2 months. Unable to afford dental care/extraction.  Ibuprofen 200 mg has not helped as much in the last 24 hours.  Has an IUD; last menstrual cycle in the last 1 to 2 months.  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 Authorizing Provider  amoxicillin-clavulanate (AUGMENTIN) 875-125 MG tablet Take 1 tablet by mouth 2 (two) times daily for 7 days. 11/24/22 12/01/22 Yes Nasiyah Laverdiere, Janace Aris, MD  ketorolac (TORADOL) 10 MG tablet Take 1 tablet (10 mg total) by mouth every 6 (six) hours as needed (pain). 11/24/22  Yes Kavita Bartl, Janace Aris, MD  chlorhexidine (PERIDEX) 0.12 % solution Use as directed 15 mLs in the mouth or throat 2 (two) times daily. 09/08/22   Poggi, Herb Grays, PA-C  chlorhexidine (PERIDEX) 0.12 % solution Use as directed 15 mLs in the mouth or throat 2 (two) times daily. 10/21/22   Poggi, Herb Grays, PA-C  levonorgestrel (MIRENA, 52 MG,) 20 MCG/DAY IUD 1 each by Intrauterine route once. Inserted on 10/07/2020     [provider]  Multiple Vitamins-Minerals (MULTIVITAMIN WITH MINERALS) tablet Take 1 tablet by mouth daily. 09/03/18   Larene Pickett, FNP    Family History Family History  Problem Relation Age of Onset   Diabetes Mother    Hypertension Mother    Breast cancer Maternal Aunt    Diabetes Maternal Aunt    Diabetes Father    Hypertension Father    Diabetes Maternal Grandmother    Hypertension Maternal Grandmother    Diabetes Maternal Grandfather    Hypertension Maternal Grandfather    Diabetes Paternal Grandmother     Social History Social History   Tobacco Use   Smoking status: Some Days    Types: Cigarettes   Smokeless tobacco: Never  Vaping Use   Vaping status: Never Used  Substance Use Topics   Alcohol use: Yes    Comment: occasions   Drug use: Yes    Types: Marijuana     Allergies   Patient has no known allergies.   Review of Systems Review of Systems  Physical Exam Triage Vital Signs ED Triage Vitals  Encounter Vitals Group     BP 11/24/22 0845 122/85     Systolic BP Percentile --      Diastolic BP Percentile --      Pulse Rate 11/24/22 0845 76     Resp 11/24/22 0845 18     Temp 11/24/22 0845 99 F (37.2 C)     Temp Source 11/24/22 0845 Oral     SpO2 11/24/22 0845 98 %     Weight --      Height --      Head Circumference --      Peak Flow --      Pain Score 11/24/22 0846 10     Pain Loc --      Pain Education --      Exclude from Growth Chart --    No data found.  Updated Vital Signs BP 122/85 (BP Location: Left Arm)   Pulse 76   Temp 99 F (37.2 C) (Oral)   Resp 18   SpO2 98%   Visual Acuity Right Eye Distance:   Left Eye Distance:   Bilateral Distance:    Right Eye Near:   Left Eye Near:    Bilateral Near:     Physical Exam Vitals reviewed.  Constitutional:      General: She is not in acute distress.    Appearance: She is not ill-appearing, toxic-appearing or diaphoretic.  HENT:     Mouth/Throat:     Mouth:  Mucous membranes are moist.     Comments: There are carious teeth noted in her left lower dental ridge. Eyes:     Extraocular Movements: Extraocular movements intact.     Pupils: Pupils are equal, round, and reactive to light.  Skin:    Coloration: Skin is not pale.  Neurological:     Mental Status: She is alert and oriented to person, place, and time.  Psychiatric:        Behavior: Behavior normal.      UC Treatments / Results  Labs (all labs ordered are listed, but only abnormal results are displayed) Labs Reviewed - No data to display  EKG   Radiology No results found.  Procedures Procedures (including critical care time)  Medications Ordered in UC Medications  ketorolac (TORADOL) 30 MG/ML injection 30 mg (has no administration in time range)    Initial Impression / Assessment and Plan / UC Course  I have reviewed the triage vital signs and the nursing notes.  Pertinent labs & imaging results that were available during my care of the patient were reviewed by me and considered in my medical decision making (see chart for details).        Toradol injection is given here and Toradol tablets are sent to the pharmacy for pain.  Augmentin is sent in for the infection.  She is given contact information for the [facility] in case they can help work get some dental care at a lower cost. Final Clinical Impressions(s) / UC Diagnoses   Final diagnoses:  Dental infection     Discharge Instructions      You have been given a shot of Toradol 30 mg today.  Ketorolac 10 mg tablets--take 1 tablet every 6 hours as needed for pain.  This is the same medicine that is in the shot we just gave you  Take amoxicillin-clavulanate 875 mg--1 tab twice daily with food for 7 days  Try calling [facility] at [phone] to see if you can have a lower cost dental care there.     ED Prescriptions     Medication Sig Dispense Auth.  Provider   amoxicillin-clavulanate (AUGMENTIN) 875-125 MG tablet Take 1 tablet by mouth 2 (two) times daily for 7 days. 14 tablet Guneet Delpino, Janace Aris, MD   ketorolac (TORADOL) 10 MG tablet Take 1 tablet (10 mg total) by mouth every 6 (six) hours as needed (pain). 20 tablet Matthews Franks, Janace Aris, MD      PDMP not reviewed this encounter.   Zenia Resides, MD 11/24/22 [phone]    Zenia Resides, MD 11/24/22 1324    Zenia Resides, MD 11/24/22 [phone]

## 2022-12-28 ENCOUNTER — Ambulatory Visit
Admission: EM | Admit: 2022-12-28 | Discharge: 2022-12-28 | Disposition: A | Discharge status: Home / Self Care | Payer: 59 | Attending: Emergency Medicine | Admitting: Emergency Medicine

## 2022-12-28 DIAGNOSIS — K0889 Other specified disorders of teeth and supporting structures: Secondary | ICD-10-CM | POA: Diagnosis not present

## 2022-12-28 MED ORDER — CLINDAMYCIN HCL 150 MG PO CAPS: 150.0000 mg | ORAL_CAPSULE | Freq: Three times a day (TID) | ORAL | 0 refills | Status: AC

## 2022-12-28 MED ORDER — ACETAMINOPHEN-CODEINE 300-30 MG PO TABS: 1.0000 | ORAL_TABLET | Freq: Four times a day (QID) | ORAL | 0 refills | Status: DC | PRN

## 2022-12-28 MED ORDER — KETOROLAC TROMETHAMINE 30 MG/ML IJ SOLN
30.0000 mg | Freq: Once | INTRAMUSCULAR | Status: AC
  Administered 2022-12-28: 30 mg via INTRAMUSCULAR

## 2022-12-28 NOTE — ED Triage Notes (Addendum)
Patient presents to UC for continued left lower dental pain. States she was not able to get in to see a dentist. Has been treating pain with ibuprofen and Toradol.

## 2022-12-28 NOTE — Discharge Instructions (Signed)
You need for dental pain  Begin clindamycin every 8 hours for 7 days to clear any bacteria contributing to symptoms  You may continue use of ibuprofen as needed, may take 800 mg every 8 hours  May take Tylenol 3 which has codeine in it for severe pain, may use every 6 hours, please wean off of this will make you sleepy  May attempt use of salt water gargles throat lozenges warm liquids and soft foods for additional comfort  You have been given information to local dentist office please follow-up for reevaluation as they are the only ones who can manage and treat your symptoms

## 2022-12-28 NOTE — ED Provider Notes (Addendum)
Kathleen Leach    CSN: [id number] Arrival date & time: 12/28/22  8657      History   Chief Complaint Chief Complaint  Patient presents with   Dental Pain    HPI Kathleen Leach is a 37 y.o. female.   Patient presents for evaluation of left-sided lower dental pain beginning 1 day ago.  Has attempted use of ibuprofen 800 mg and Toradol which has provided minimal relief.  Is not currently established with dentist.  Denies fever or drainage.  Able to tolerate food and liquids.  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 Authorizing Provider  acetaminophen-codeine (TYLENOL #3) 300-30 MG tablet Take 1-2 tablets by mouth every 6 (six) hours as needed for moderate pain (pain score 4-6). 12/28/22  Yes Kiora Hallberg R, NP  clindamycin (CLEOCIN) 150 MG capsule Take 1 capsule (150 mg total) by mouth 3 (three) times daily for 7 days. 12/28/22 01/04/23 Yes Anayiah Howden R, NP  chlorhexidine (PERIDEX) 0.12 % solution Use as directed 15 mLs in the mouth or throat 2 (two) times daily. 09/08/22   Poggi, Herb Grays, PA-C  chlorhexidine (PERIDEX) 0.12 % solution Use as directed 15 mLs in the mouth or throat 2 (two) times daily. 10/21/22   Poggi, Herb Grays, PA-C  ketorolac (TORADOL) 10 MG tablet Take 1 tablet (10 mg total) by mouth every 6 (six) hours as needed  (pain). 11/24/22   Zenia Resides, MD  levonorgestrel (MIRENA, 52 MG,) 20 MCG/DAY IUD 1 each by Intrauterine route once. Inserted on 10/07/2020    [provider]  Multiple Vitamins-Minerals (MULTIVITAMIN WITH MINERALS) tablet Take 1 tablet by mouth daily. 09/03/18   Larene Pickett, FNP    Family History Family History  Problem Relation Age of Onset   Diabetes Mother    Hypertension Mother    Breast cancer Maternal Aunt    Diabetes Maternal Aunt    Diabetes Father    Hypertension Father    Diabetes Maternal Grandmother    Hypertension Maternal Grandmother    Diabetes Maternal Grandfather    Hypertension Maternal Grandfather    Diabetes Paternal Grandmother     Social History Social History   Tobacco Use   Smoking status: Some Days    Types: Cigarettes   Smokeless tobacco: Never  Vaping Use   Vaping status: Never Used  Substance Use Topics   Alcohol use: Yes    Comment: occasions   Drug use: Yes    Types: Marijuana  Allergies   Patient has no known allergies.   Review of Systems Review of Systems   Physical Exam Triage Vital Signs ED Triage Vitals  Encounter Vitals Group     BP 12/28/22 0853 (!) 153/83     Systolic BP Percentile --      Diastolic BP Percentile --      Pulse Rate 12/28/22 0853 72     Resp 12/28/22 0853 17     Temp 12/28/22 0853 99 F (37.2 C)     Temp Source 12/28/22 0853 Oral     SpO2 12/28/22 0853 98 %     Weight --      Height --      Head Circumference --      Peak Flow --      Pain Score 12/28/22 0859 10     Pain Loc --      Pain Education --      Exclude from Growth Chart --    No data found.  Updated Vital Signs BP (!) 153/83 (BP Location: Left Arm)   Pulse 72   Temp 99 F (37.2 C) (Oral)   Resp 17   SpO2 98%   Visual Acuity Right Eye Distance:   Left Eye Distance:   Bilateral Distance:    Right Eye Near:   Left Eye Near:    Bilateral Near:     Physical Exam Constitutional:      Appearance:  Normal appearance.  HENT:     Mouth/Throat:     Comments: Dental decay along the left lower gumline, mild gingival swelling, no abscess noted Eyes:     Extraocular Movements: Extraocular movements intact.  Pulmonary:     Effort: Pulmonary effort is normal.  Neurological:     Mental Status: She is alert and oriented to person, place, and time. Mental status is at baseline.      UC Treatments / Results  Labs (all labs ordered are listed, but only abnormal results are displayed) Labs Reviewed - No data to display  EKG   Radiology No results found.  Procedures Procedures (including critical care time)  Medications Ordered in UC Medications  ketorolac (TORADOL) 30 MG/ML injection 30 mg (has no administration in time range)    Initial Impression / Assessment and Plan / UC Course  I have reviewed the triage vital signs and the nursing notes.  Pertinent labs & imaging results that were available during my care of the patient were reviewed by me and considered in my medical decision making (see chart for details).  Dental pain  Providing coverage for bacteria, Toradol IM given, most recently used Augmentin within the last month therefore prescribed clindamycin's treatment additionally prescribed Tylenol 3 for pain management, PDMP reviewed, low risk, may continue use of NSAIDs additionally recommended additional supportive care and given walker referral to local dentist office for reevaluation, work note given Final Clinical Impressions(s) / UC Diagnoses   Final diagnoses:  Pain, dental     Discharge Instructions      You need for dental pain  Begin clindamycin every 8 hours for 7 days to clear any bacteria contributing to symptoms  You may continue use of ibuprofen as needed, may take 800 mg every 8 hours  May take Tylenol 3 which has codeine in it for severe pain, may use every 6 hours, please wean off of this will make you sleepy  May attempt use of salt water  gargles throat lozenges warm liquids and soft foods  for additional comfort  You have been given information to local dentist office please follow-up for reevaluation as they are the only ones who can manage and treat your symptoms   ED Prescriptions     Medication Sig Dispense Auth. Provider   clindamycin (CLEOCIN) 150 MG capsule Take 1 capsule (150 mg total) by mouth 3 (three) times daily for 7 days. 21 capsule Tayte Mcwherter R, NP   acetaminophen-codeine (TYLENOL #3) 300-30 MG tablet Take 1-2 tablets by mouth every 6 (six) hours as needed for moderate pain (pain score 4-6). 15 tablet Katarzyna Wolven, Elita Boone, NP      I have reviewed the PDMP during this encounter.   Valinda Hoar, NP 12/28/22 0929    Valinda Hoar, NP 12/28/22 [phone]

## 2023-01-10 ENCOUNTER — Other Ambulatory Visit: Payer: Self-pay

## 2023-01-10 ENCOUNTER — Emergency Department
Admission: EM | Admit: 2023-01-10 | Discharge: 2023-01-10 | Disposition: A | Discharge status: Home / Self Care | Payer: 59 | Attending: Emergency Medicine | Admitting: Emergency Medicine

## 2023-01-10 DIAGNOSIS — K0889 Other specified disorders of teeth and supporting structures: Secondary | ICD-10-CM | POA: Diagnosis not present

## 2023-01-10 MED ORDER — PENICILLIN V POTASSIUM 500 MG PO TABS: 500.0000 mg | ORAL_TABLET | Freq: Four times a day (QID) | ORAL | 0 refills | Status: DC

## 2023-01-10 MED ORDER — KETOROLAC TROMETHAMINE 15 MG/ML IJ SOLN
15.0000 mg | Freq: Once | INTRAMUSCULAR | Status: AC
  Administered 2023-01-10: 15 mg via INTRAMUSCULAR
  Filled 2023-01-10: qty 1

## 2023-01-10 MED ORDER — CLINDAMYCIN HCL 300 MG PO CAPS: 300.0000 mg | ORAL_CAPSULE | Freq: Four times a day (QID) | ORAL | 0 refills | Status: AC

## 2023-01-10 MED ORDER — CLINDAMYCIN HCL 150 MG PO CAPS
300.0000 mg | ORAL_CAPSULE | Freq: Once | ORAL | Status: AC
  Administered 2023-01-10: 300 mg via ORAL
  Filled 2023-01-10: qty 2

## 2023-01-10 NOTE — ED Provider Notes (Signed)
   [facility] Provider Note    Event Date/Time   First MD Initiated Contact with Patient 01/10/23 [phone]     (approximate)   History   Dental Pain   HPI  Kathleen Leach is a 37 y.o. female  who presents to the emergency department today because of concern for left dental pain. This has been a recurrent problem for the patient over the past few months. 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      Physical Exam   Triage Vital Signs: ED Triage Vitals  Encounter Vitals Group     BP 01/10/23 0009 (!) 141/93     Systolic BP Percentile --      Diastolic BP Percentile --      Pulse Rate 01/10/23 0009 71     Resp 01/10/23 0009 18     Temp 01/10/23 0009 98.7 F (37.1 C)     Temp Source 01/10/23 0009 Oral     SpO2 01/10/23 0009 99 %     Weight 01/10/23 0008 200 lb (90.7 kg)     Height 01/10/23 0008 5\' 7"  (1.702 m)     Head Circumference --      Peak Flow --      Pain Score 01/10/23 0008 10     Pain Loc --      Pain Education --      Exclude from Growth Chart --     Most recent vital signs: Vitals:   01/10/23 0009  BP: (!) 141/93  Pulse: 71  Resp: 18  Temp: 98.7 F (37.1 C)  SpO2: 99%   General: Awake, alert, oriented. CV:  Good peripheral perfusion.  Resp:  Normal effort.  Abd:  No distention.  Other:  No dental swelling   ED Results / Procedures / Treatments   Labs (all labs ordered are listed, but only abnormal results are displayed) Labs Reviewed - No data to display   EKG  None   RADIOLOGY None   PROCEDURES:  Critical Care performed: No  MEDICATIONS ORDERED IN ED: Medications - No data to display   IMPRESSION / MDM / ASSESSMENT AND PLAN / ED COURSE  I reviewed the triage vital signs and the nursing notes.                              Differential diagnosis includes, but is not limited to, dental carries, dental infection  Patient's  presentation is most consistent with acute presentation with potential threat to life or bodily function.   Patient presented to the emergency department today because of concerns for left sided dental pain.  Patient has had similar symptoms in the past.  Patient afebrile here.  Will plan on giving course of antibiotics.  Did encourage patient to follow-up with dentistry.      FINAL CLINICAL IMPRESSION(S) / ED DIAGNOSES   Final diagnoses:  Pain, dental      Note:  This document was prepared using Dragon voice recognition software and may include unintentional dictation errors.    Phineas Semen, MD 01/10/23 0111

## 2023-01-10 NOTE — ED Triage Notes (Signed)
Pt reports left side dental pain x2days.

## 2023-01-14 ENCOUNTER — Emergency Department
Admission: EM | Admit: 2023-01-14 | Discharge: 2023-01-14 | Disposition: A | Discharge status: Home / Self Care | Payer: 59 | Attending: Emergency Medicine | Admitting: Emergency Medicine

## 2023-01-14 ENCOUNTER — Other Ambulatory Visit: Payer: Self-pay

## 2023-01-14 DIAGNOSIS — M7989 Other specified soft tissue disorders: Secondary | ICD-10-CM

## 2023-01-14 DIAGNOSIS — L509 Urticaria, unspecified: Secondary | ICD-10-CM | POA: Diagnosis not present

## 2023-01-14 DIAGNOSIS — R2230 Localized swelling, mass and lump, unspecified upper limb: Secondary | ICD-10-CM | POA: Diagnosis not present

## 2023-01-14 MED ORDER — PREDNISONE 10 MG (21) PO TBPK: ORAL_TABLET | ORAL | 0 refills | Status: DC

## 2023-01-14 MED ORDER — FAMOTIDINE 20 MG PO TABS
20.0000 mg | ORAL_TABLET | Freq: Once | ORAL | Status: AC
  Administered 2023-01-14: 20 mg via ORAL
  Filled 2023-01-14: qty 1

## 2023-01-14 MED ORDER — PREDNISONE 20 MG PO TABS
60.0000 mg | ORAL_TABLET | Freq: Once | ORAL | Status: AC
  Administered 2023-01-14: 60 mg via ORAL
  Filled 2023-01-14: qty 3

## 2023-01-14 NOTE — ED Provider Notes (Signed)
[facility] Emergency Department Provider Note     Event Date/Time   First MD Initiated Contact with Patient 01/14/23 1725     (approximate)   History   Arm Swelling   HPI  Kathleen Leach is a 37 y.o. female presents to the ED for evaluation of left hand and forearm swelling noticed today.  Patient believes she was bitten by something yesterday.  Unknown of what.  There is a localized rash to the right inner thigh.  Pruritic in nature.  Patient denies fever and shortness of breath.     Physical Exam   Triage Vital Signs: ED Triage Vitals  Encounter Vitals Group     BP 01/14/23 1722 (!) 154/86     Systolic BP Percentile --      Diastolic BP Percentile --      Pulse Rate 01/14/23 1722 96     Resp 01/14/23 1722 18     Temp 01/14/23 1722 98 F (36.7 C)     Temp src --      SpO2 01/14/23 1722 100 %     Weight --      Height --      Head Circumference --      Peak Flow --      Pain Score 01/14/23 1721 2     Pain Loc --      Pain Education --      Exclude from Growth Chart --     Most recent vital signs: Vitals:   01/14/23 1722  BP: (!) 154/86  Pulse: 96  Resp: 18  Temp: 98 F (36.7 C)  SpO2: 100%    General Awake, no distress.  HEENT NCAT. PERRL. EOMI. No rhinorrhea. Mucous membranes are moist.  Airway is patent.  Oropharynx is clear. CV:  Good peripheral perfusion.  RESP:  Normal effort.  ABD:  No distention.  Other:  Left hand and forearm reveals moderate edema and mild erythema.  Nontender.  Full ROM without tenderness.  Neurovascular status intact all throughout.  Capillary refills brisk and normal.  Right thigh on the medial aspect reveals a single erythremic palm sized urticaria.    ED Results / Procedures / Treatments   Labs (all labs ordered are listed, but only abnormal results are displayed) Labs Reviewed - No data to display  No results found.  PROCEDURES:  Critical Care performed:  No  Procedures  MEDICATIONS ORDERED IN ED: Medications  famotidine (PEPCID) tablet 20 mg (20 mg Oral Given 01/14/23 1800)  predniSONE (DELTASONE) tablet 60 mg (60 mg Oral Given 01/14/23 1801)     IMPRESSION / MDM / ASSESSMENT AND PLAN / ED COURSE  I reviewed the triage vital signs and the nursing notes.                               37 y.o. female presents to the emergency department for evaluation and treatment of arm swelling and rash. See HPI for further details.   Differential diagnosis includes, but is not limited to inspect bite, localized reaction, cellulitis, angioedema  Patient's presentation is most consistent with acute illness / injury with system symptoms.  Patient's presentation is clinically consistent with a localized reaction to a possible insect bite.  Patient reports it did feel like there was a bite, but no evidence of stinger or pinpoint skin lesion noted on physical exam.  Patient is not having systemic symptoms.  She is not short of breath and absence of angioedema.   ED management with famotidine and steroid.  Patient is encouraged to continue taking Benadryl and use Ace wrap for compression.  RICE therapy education provided.  Patient is encouraged to closely monitor for spread of swelling and redness however I have low suspicions of cellulitis given presentation.  Patient will be discharged home.  She is in stable condition.  ED precautions discussed.  She is encouraged to follow-up with her primary care in 1 week as needed.  FINAL CLINICAL IMPRESSION(S) / ED DIAGNOSES   Final diagnoses:  Arm swelling  Urticaria   Rx / DC Orders   ED Discharge Orders          Ordered    predniSONE (STERAPRED UNI-PAK 21 TAB) 10 MG (21) TBPK tablet  Status:  Discontinued        01/14/23 1832    predniSONE (STERAPRED UNI-PAK 21 TAB) 10 MG (21) TBPK tablet        01/14/23 1835             Note:  This document was prepared using Dragon voice recognition software and  may include unintentional dictation errors.    Romeo Apple, Favour Aleshire A, PA-C 01/14/23 1856    Minna Antis, MD 01/14/23 [phone]

## 2023-01-14 NOTE — ED Triage Notes (Signed)
Pt comes with c/o left hand and lower arm swelling. Pt states she woke up and noticed it. Pt states she also just noticed her right leg swelling where it looks like something bit her.

## 2023-01-14 NOTE — Discharge Instructions (Signed)
Please pick up steroids from your local pharmacy.  Use Ace wrap for compression.  Rest and apply ice to the affected area.  Elevate arm above heart level to help with swelling.  Take Benadryl up to 50 mg every 8 hours as needed for itching.  Follow-up with your primary care as needed

## 2023-03-15 ENCOUNTER — Emergency Department
Admission: EM | Admit: 2023-03-15 | Discharge: 2023-03-15 | Discharge status: Against Medical Advice | Payer: 59 | Source: Home / Self Care

## 2023-03-15 DIAGNOSIS — F17210 Nicotine dependence, cigarettes, uncomplicated: Secondary | ICD-10-CM | POA: Diagnosis not present

## 2023-03-15 DIAGNOSIS — K0889 Other specified disorders of teeth and supporting structures: Secondary | ICD-10-CM | POA: Diagnosis not present

## 2023-03-15 DIAGNOSIS — K029 Dental caries, unspecified: Secondary | ICD-10-CM | POA: Diagnosis not present

## 2023-03-25 ENCOUNTER — Emergency Department: Payer: 59

## 2023-03-25 ENCOUNTER — Other Ambulatory Visit: Payer: Self-pay

## 2023-03-25 ENCOUNTER — Emergency Department
Admission: EM | Admit: 2023-03-25 | Discharge: 2023-03-25 | Disposition: A | Discharge status: Home / Self Care | Payer: 59 | Attending: Emergency Medicine | Admitting: Emergency Medicine

## 2023-03-25 DIAGNOSIS — H6982 Other specified disorders of Eustachian tube, left ear: Secondary | ICD-10-CM | POA: Insufficient documentation

## 2023-03-25 DIAGNOSIS — K029 Dental caries, unspecified: Secondary | ICD-10-CM | POA: Insufficient documentation

## 2023-03-25 DIAGNOSIS — K0889 Other specified disorders of teeth and supporting structures: Secondary | ICD-10-CM | POA: Diagnosis present

## 2023-03-25 DIAGNOSIS — H6992 Unspecified Eustachian tube disorder, left ear: Secondary | ICD-10-CM

## 2023-03-25 MED ORDER — NEOMYCIN-POLYMYXIN-HC 3.5-10000-1 OT SOLN: 3.0000 [drp] | Freq: Three times a day (TID) | OTIC | 0 refills | Status: AC

## 2023-03-25 MED ORDER — PREDNISONE 10 MG PO TABS: ORAL_TABLET | ORAL | 0 refills | Status: DC

## 2023-03-25 MED ORDER — CLINDAMYCIN HCL 300 MG PO CAPS: 300.0000 mg | ORAL_CAPSULE | Freq: Three times a day (TID) | ORAL | 0 refills | Status: AC

## 2023-03-25 NOTE — Discharge Instructions (Signed)
 Sent to your pharmacy to begin taking today.  Also a list of dental clinics as listed on your discharge papers along with a separate paper that gives you information about [facility] dental walk-in clinics.  Call tomorrow if you prefer make an appointment.  You may also take Tylenol  with this medication but do not take anti-inflammatories such as ibuprofen  or Aleve .  OPTIONS FOR DENTAL FOLLOW UP CARE  [place] [facility] - Local Safety Net Dental Clinics [email or web address]   [facility] [phone])  Norita Goldberg [phone])  [place] [phone])  [facility] [phone])  [facility] [phone]) This clinic caters to the indigent population and is on a lottery system. Location: [employer] of Dentistry, [employer], [address], [place] Clinic Hours: Wednesdays from 6pm - 9pm, patients seen by a lottery system. For dates, call or go to [email or web address] Services: Cleanings, fillings and simple extractions. Payment Options: DENTAL WORK IS FREE OF CHARGE. Bring proof of income or support. Best way to get seen: Arrive at 5:15 pm - this is a lottery, NOT first come/first serve, so arriving earlier will not increase your chances of being seen.     [facility] [phone] Select option 1 for emergencies   Location: [facility] Dentistry, [place], [address] [place] Clinic Hours: No walk-ins accepted - call the day before to schedule an appointment. Check in times are 9:30 am and 1:30 pm. Services: Simple extractions, temporary fillings, pulpectomy/pulp debridement, uncomplicated abscess drainage. Payment Options: PAYMENT IS DUE AT THE TIME OF SERVICE.  Fee is usually $100-200, additional surgical procedures (e.g. abscess drainage) may be extra. Cash,  checks, Visa/MasterCard accepted.  Can file Medicaid if patient is covered for dental - patient should call case worker to check. No discount for [facility] patients. Best way to get seen: MUST call the day before and get onto the schedule. Can usually be seen the next 1-2 days. No walk-ins accepted.     [facility] [phone]   Location: [facility], [address] [place] Clinic Hours: M, W, Th, F 8am or 1:30pm, Tues 9a or 1:30 - first come/first served. Services: Simple extractions, temporary fillings, uncomplicated abscess drainage.  You do not need to be an [facility] resident. Payment Options: PAYMENT IS DUE AT THE TIME OF SERVICE. Dental insurance, otherwise sliding scale - bring proof of income or support. Depending on income and treatment needed, cost is usually $50-200. Best way to get seen: Arrive early as it is first come/first served.     [facility] [phone]   Location: [address] Clinic Hours: Mon-Thu 8a-5p Services: Most basic dental services including extractions and fillings. Payment Options: PAYMENT IS DUE AT THE TIME OF SERVICE. Sliding scale, up to 50% off - bring proof if income or support. Medicaid with dental option accepted. Best way to get seen: Call to schedule an appointment, can usually be seen within 2 weeks OR they will try to see walk-ins - show up at 8a or 2p (you may have to wait).     [facility] [phone] [place] RESIDENTS ONLY   Location: [facility], [address] [place], [place] [address] Clinic Hours: By appointment only. Monday - Thursday 8am-5pm, Friday 8am-12pm Services: Cleanings, fillings, extractions. Payment Options: PAYMENT IS DUE AT THE TIME OF SERVICE. Cash, Visa or MasterCard.  Sliding scale - $30 minimum per service. Best way to get seen: Come in to office, complete  packet and make an appointment - need proof of income or support monies for each household member and proof of [facility] residence. Usually takes about a month to get in.     [facility] [phone]   Location: [address], [facility] Clinic Hours: Walk-in Urgent Care Dental Services are offered Monday-Friday mornings only. The numbers of emergencies accepted daily is limited to the number of providers available. Maximum 15 - Mondays, Wednesdays & Thursdays Maximum 10 - Tuesdays & Fridays Services: You do not need to be a [facility] resident to be seen for a dental emergency. Emergencies are defined as pain, swelling, abnormal bleeding, or dental trauma. Walkins will receive x-rays if needed. NOTE: Dental cleaning is not an emergency. Payment Options: PAYMENT IS DUE AT THE TIME OF SERVICE. Minimum co-pay is $40.00 for uninsured patients. Minimum co-pay is $3.00 for Medicaid with dental coverage. Dental Insurance is accepted and must be presented at time of visit. Medicare does not cover dental. Forms of payment: Cash, credit card, checks. Best way to get seen: If not previously registered with the clinic, walk-in dental registration begins at 7:15 am and is on a first come/first serve basis. If previously registered with the clinic, call to make an appointment.     [facility] [phone] [place] RESIDENTS ONLY   Location: [address], [place], [place] Clinic Hours: Mon-Thu 10a-2p Services: Extractions only! Payment Options: FREE (donations accepted) - bring proof of income or support Best way to get seen: Call and schedule an appointment OR come at 8am on the 1st Monday of every month (except for holidays) when it is first come/first served.     [facility] [phone]   Location: [address] [place], [place] Clinic Hours: Friday mornings Services, Payment Options, Best way to get seen: Call for info

## 2023-03-25 NOTE — ED Provider Notes (Signed)
 [facility] Provider Note    Event Date/Time   First MD Initiated Contact with Patient 03/25/23 1207     (approximate)   History   Influenza   HPI  Kathleen Leach is a 38 y.o. female   presents to the ED with multiple complaints.  Patient has been seen multiple times at various emergency departments for dental pain.  Patient was given a list of dental clinics but has not made an appointment yet.  Patient also complains of left ear pain and was seen in [facility] where she was prescribed Ciprodex.  She states that she did not get this medication as it was extremely expensive.  She also was diagnosed as having the flu.  In triage patient complained of a headache to the back of her head and a CT scan was ordered from triage.      Physical Exam   Triage Vital Signs: ED Triage Vitals  Encounter Vitals Group     BP 03/25/23 1149 (!) 146/100     Systolic BP Percentile --      Diastolic BP Percentile --      Pulse Rate 03/25/23 1149 76     Resp 03/25/23 1149 18     Temp 03/25/23 1149 98 F (36.7 C)     Temp src --      SpO2 03/25/23 1149 100 %     Weight 03/25/23 1147 200 lb (90.7 kg)     Height 03/25/23 1147 5' 7 (1.702 m)     Head Circumference --      Peak Flow --      Pain Score 03/25/23 1147 8     Pain Loc --      Pain Education --      Exclude from Growth Chart --     Most recent vital signs: Vitals:   03/25/23 1149  BP: (!) 146/100  Pulse: 76  Resp: 18  Temp: 98 F (36.7 C)  SpO2: 100%     General: Awake, no distress.  Nontoxic in appearance. CV:  Good peripheral perfusion.  Resp:  Normal effort.  Abd:  No distention.  Other:  Right EAC occluded with cerumen.  Left EAC with some minimal exudate and cerumen.  TM without erythema or injection.  There is some minimal tenderness on palpation left ear tragus.  Neck is supple without cervical lymphadenopathy.  No obvious dental abscesses noted.  Posterior pharynx without erythema or  exudate.  Uvula is midline.  No midline cervical tenderness on palpation.  Patient is able to flex and extend neck without any difficulty and lateral movement is not restricted.   ED Results / Procedures / Treatments   Labs (all labs ordered are listed, but only abnormal results are displayed) Labs Reviewed - No data to display   RADIOLOGY CT scan of the head ordered from triage per radiology is negative for acute intracranial changes.   PROCEDURES:  Critical Care performed:   Procedures   MEDICATIONS ORDERED IN ED: Medications - No data to display   IMPRESSION / MDM / ASSESSMENT AND PLAN / ED COURSE  I reviewed the triage vital signs and the nursing notes.   Differential diagnosis includes, but is not limited to, influenza, dental pain, left ear pain, otitis media, otitis externa, eustachian tube dysfunction, cerumen impaction, headache posteriorly,  38 year old female presents to the ED with multiple complaints from dental pain to left ear pain and headache.  A CT head was ordered from  triage and reported negative per radiology.  Physical exam was more concerning for eustachian tube dysfunction.  Patient has chronic dental caries and has had multiple visits to the emergency department but has not contacted a dental office.  Information for dental clinics including the walk-in clinic for [place] and [place] was given to her.  A prescription for clindamycin  was given to her to help with the gum pain and chronic dental caries with strong recommendation that she follow-up with the dentist.  A prescription for Cortisporin otic solution and prednisone  was sent to the pharmacy.  Patient was given a prescription for Ciprodex at another hospital but she states she was unable to purchase this as it was $130.Prednisone  30 mg daily for the next 5 days.      Patient's presentation is most consistent with acute complicated illness / injury requiring diagnostic workup.  FINAL  CLINICAL IMPRESSION(S) / ED DIAGNOSES   Final diagnoses:  Eustachian tube dysfunction, left  Pain due to dental caries     Rx / DC Orders   ED Discharge Orders          Ordered    predniSONE  (DELTASONE ) 10 MG tablet        03/25/23 1252    clindamycin  (CLEOCIN ) 300 MG capsule  3 times daily        03/25/23 1252    neomycin -polymyxin-hydrocortisone (CORTISPORIN) OTIC solution  3 times daily        03/25/23 1252             Note:  This document was prepared using Dragon voice recognition software and may include unintentional dictation errors.   Saunders Shona CROME, PA-C 03/25/23 1440    Dorothyann Drivers, MD 03/25/23 [phone]

## 2023-03-25 NOTE — ED Notes (Signed)
 See triage note.  Presents with pain to left ear and side of head  States she was dx'd with flu  then and ear infection   But also has had dental pain  Afebrile on arrival

## 2023-03-25 NOTE — ED Triage Notes (Signed)
 Pt comes with c/o flu, sharp neck and head pain. Pt states dx with flu a few days ago along with ear pain and dental issues.

## 2023-09-06 ENCOUNTER — Encounter: Payer: Self-pay | Admitting: Emergency Medicine

## 2023-09-06 ENCOUNTER — Ambulatory Visit
Admission: EM | Admit: 2023-09-06 | Discharge: 2023-09-06 | Disposition: A | Discharge status: Home / Self Care | Payer: Self-pay | Attending: Emergency Medicine | Admitting: Emergency Medicine

## 2023-09-06 DIAGNOSIS — K0889 Other specified disorders of teeth and supporting structures: Secondary | ICD-10-CM

## 2023-09-06 MED ORDER — KETOROLAC TROMETHAMINE 30 MG/ML IJ SOLN
30.0000 mg | Freq: Once | INTRAMUSCULAR | Status: AC
  Administered 2023-09-06: 30 mg via INTRAMUSCULAR

## 2023-09-06 MED ORDER — AMOXICILLIN-POT CLAVULANATE 875-125 MG PO TABS: 1.0000 | ORAL_TABLET | Freq: Two times a day (BID) | ORAL | 0 refills | Status: DC

## 2023-09-06 NOTE — ED Triage Notes (Signed)
 Patient complains of right lower dental pain x 2 day. Patient took Ibuprofen  and Tylenol  yesterday for pain

## 2023-09-06 NOTE — ED Provider Notes (Signed)
 [facility]    CSN: [id number] Arrival date & time: 09/06/23  0911      History   Chief Complaint Chief Complaint  Patient presents with   Dental Pain    HPI Kathleen Leach is a 38 y.o. female.   Patient presents for evaluation of right lower abdominal pain beginning 1 day ago.  Has been constant.  Has attempted Tylenol  and ibuprofen .  Currently in need of dental work, attempted to establish care with a [employer].  Unable to food and liquids.  Denies fever or drainage.  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 Authorizing Provider  amoxicillin -clavulanate (AUGMENTIN ) 875-125 MG tablet Take 1 tablet by mouth every 12 (twelve) hours. 09/06/23  Yes Gayle Collard R, NP  chlorhexidine  (PERIDEX ) 0.12 % solution Use as directed 15 mLs in the mouth or throat 2 (two) times daily. 09/08/22   Poggi, Jenna E, PA-C  chlorhexidine  (PERIDEX ) 0.12 % solution Use as directed 15 mLs in the mouth or throat 2 (two) times daily. 10/21/22   Poggi, Jenna E, PA-C  levonorgestrel  (MIRENA , 52 MG,) 20 MCG/DAY IUD 1 each by Intrauterine route once. Inserted on 10/07/2020    [provider]  Multiple Vitamins-Minerals (MULTIVITAMIN WITH MINERALS) tablet Take 1 tablet by mouth daily. 09/03/18   Murphy Channel, FNP  predniSONE  (DELTASONE ) 10 MG tablet Take 3  tablets once a day for 5 days 03/25/23   Saunders Shona CROME, PA-C    Family History Family History  Problem Relation Age of Onset   Diabetes Mother    Hypertension Mother    Breast cancer Maternal Aunt    Diabetes Maternal Aunt    Diabetes Father    Hypertension Father    Diabetes Maternal Grandmother    Hypertension Maternal Grandmother    Diabetes Maternal Grandfather    Hypertension Maternal Grandfather    Diabetes Paternal Grandmother     Social History Social History   Tobacco Use   Smoking status: Some Days    Types: Cigarettes   Smokeless tobacco: Never  Vaping Use   Vaping status: Never Used  Substance Use Topics   Alcohol use: Yes    Comment: occasions   Drug use: Yes    Types: Marijuana     Allergies   Patient has no known allergies.   Review of Systems Review of Systems   Physical Exam Triage Vital Signs ED Triage Vitals  Encounter Vitals Group     BP 09/06/23 0930 131/85  Girls Systolic BP Percentile --      Girls Diastolic BP Percentile --      Boys Systolic BP Percentile --      Boys Diastolic BP Percentile --      Pulse Rate 09/06/23 0930 75     Resp 09/06/23 0930 18     Temp 09/06/23 0930 98.3 F (36.8 C)     Temp Source 09/06/23 0930 Oral     SpO2 09/06/23 0930 100 %     Weight --      Height --      Head Circumference --      Peak Flow --      Pain Score 09/06/23 0932 5     Pain Loc --      Pain Education --      Exclude from Growth Chart --    No data found.  Updated Vital Signs BP 131/85 (BP Location: Left Arm)   Pulse 75   Temp 98.3 F (36.8 C) (Oral)   Resp 18   LMP  (LMP Unknown)   SpO2 100%   Visual Acuity Right Eye Distance:   Left Eye Distance:   Bilateral Distance:    Right Eye Near:   Left Eye Near:    Bilateral Near:     Physical Exam Constitutional:      Appearance: Normal appearance.  HENT:     Mouth/Throat:     Comments: Dental decay along the right lower gumline with mild gingival swelling,  no drainage or abscess noted Eyes:     Extraocular Movements: Extraocular movements intact.  Pulmonary:     Effort: Pulmonary effort is normal.  Neurological:     Mental Status: She is alert and oriented to person, place, and time.      UC Treatments / Results  Labs (all labs ordered are listed, but only abnormal results are displayed) Labs Reviewed - No data to display  EKG   Radiology No results found.  Procedures Procedures (including critical care time)  Medications Ordered in UC Medications  ketorolac  (TORADOL ) 30 MG/ML injection 30 mg (30 mg Intramuscular Given 09/06/23 0949)    Initial Impression / Assessment and Plan / UC Course  I have reviewed the triage vital signs and the nursing notes.  Pertinent labs & imaging results that were available during my care of the patient were reviewed by me and considered in my medical decision making (see chart for details).  Dental pain  Concerning for infection, prescribed Augmentin  and Toradol  IM given for management of pain, recommended continue over-the-counter analgesics and advised dental follow-up with Final Clinical Impressions(s) / UC Diagnoses   Final diagnoses:  Pain, dental     Discharge Instructions      Your evaluated for your dental pain  Begin Augmentin  twice daily for 7 days to clear any germs contributing to your symptoms  You have been given an injection of Toradol  to help reduce inflammation and help with pain and ideally symptoms will start to improve within the hour  May continue use of over-the-counter Tylenol  and/or Motrin   May attempt salt water gargles throat lozenges warm liquids and soft foods  Continue the process for evaluation from dentist   ED Prescriptions     Medication Sig Dispense Auth. Provider   amoxicillin -clavulanate (AUGMENTIN ) 875-125 MG tablet Take 1 tablet by mouth every 12 (twelve) hours. 14 tablet Norris Bodley R, NP      PDMP not reviewed this encounter.    Teresa Price  R, NP 09/06/23 1021

## 2023-09-06 NOTE — Discharge Instructions (Signed)
 Your evaluated for your dental pain  Begin Augmentin  twice daily for 7 days to clear any germs contributing to your symptoms  You have been given an injection of Toradol  to help reduce inflammation and help with pain and ideally symptoms will start to improve within the hour  May continue use of over-the-counter Tylenol  and/or Motrin   May attempt salt water gargles throat lozenges warm liquids and soft foods  Continue the process for evaluation from dentist

## 2024-02-11 ENCOUNTER — Encounter: Payer: Self-pay | Admitting: Medical

## 2024-02-11 ENCOUNTER — Ambulatory Visit: Payer: Self-pay | Admitting: Medical

## 2024-02-11 ENCOUNTER — Other Ambulatory Visit: Payer: Self-pay

## 2024-02-11 VITALS — BP 110/90 | HR 93 | Temp 95.4°F | Ht 66.0 in | Wt 185.0 lb

## 2024-02-11 DIAGNOSIS — J069 Acute upper respiratory infection, unspecified: Secondary | ICD-10-CM

## 2024-02-11 LAB — POC SOFIA 2 FLU + SARS ANTIGEN FIA
Influenza A, POC: NEGATIVE
Influenza B, POC: NEGATIVE
SARS Coronavirus 2 Ag: NEGATIVE

## 2024-02-11 NOTE — Progress Notes (Unsigned)
 [facility] [address] [place], [place] [address]   Office Visit Note  Patient Name: Kathleen Leach Date of Birth [birth year]  Medical Record number [id number]  Date of Service: 02/11/2024  Chief Complaint  Patient presents with   Acute Visit    Patient c/o sneezing, body aches, fatigue, and occasional cough/SOB. Denies fever. Symptoms began about 6 days ago. She is feeling slightly better but would like tested for flu. She tried Robitussin, Benadryl, and Alka Seltzer over the past few days.     HPI 38 y.o. female presents with respiratory sx.  Sx began 4 days ago with throat irritation. Has also had runny nose, slight nasal congestion, fatigue and cough. Had some headache initially, has improved. Some body aches yesterday and today. Mild upset stomach, no true nausea or vomiting. No diarrhea. No fever suspected, has occasionally had chills or felt warm.  Has taken Alka Seltzer Cold and Flu medicine, some helpful.  Daughter recently sick with similar sx. She does smoke cigarettes occasionally.   She is generally healthy.    Current Medication:  Outpatient Encounter Medications as of 02/11/2024  Medication Sig   levonorgestrel  (MIRENA , 52 MG,) 20 MCG/DAY IUD 1 each by Intrauterine route once. Inserted on 10/07/2020   chlorhexidine  (PERIDEX ) 0.12 % solution Use as directed 15 mLs in the mouth or throat 2 (two) times daily. (Patient not taking: Reported on 02/11/2024)   [DISCONTINUED] amoxicillin -clavulanate (AUGMENTIN ) 875-125 MG tablet Take 1 tablet by mouth every 12 (twelve) hours.   [DISCONTINUED] chlorhexidine  (PERIDEX ) 0.12 % solution Use as directed 15 mLs in the mouth or throat 2 (two) times daily.   [DISCONTINUED] Multiple Vitamins-Minerals (MULTIVITAMIN WITH MINERALS) tablet Take 1 tablet by mouth daily.   [DISCONTINUED] predniSONE  (DELTASONE ) 10 MG tablet Take 3 tablets once a day for 5 days   No facility-administered encounter medications on file  as of 02/11/2024.      Medical History: Past Medical History:  Diagnosis Date   Gestational diabetes    during pregnancy   HELLP syndrome    HPV in female    Preeclampsia 2012   Scoliosis      Vital Signs: BP (!) 110/90   Pulse 93   Temp (!) 95.4 F (35.2 C)   Ht 5' 6 (1.676 m)   Wt 185 lb (83.9 kg)   SpO2 100%   BMI 29.86 kg/m    Review of Systems See HPI  Physical Exam Vitals reviewed.  Constitutional:      General: She is not in acute distress.    Comments: Tired appearing  HENT:     Head: Normocephalic.     Right Ear: Ear canal and external ear normal.     Left Ear: Ear canal and external ear normal.     Ears:     Comments: Left TM dull, slight serous middle ear fluid.  Small nonobstructive cerumen to right EAC, partially obstructs view of right TM. Visible portion of right TM normal.    Nose: Mucosal edema and rhinorrhea present. No congestion. Rhinorrhea is clear.     Mouth/Throat:     Mouth: Mucous membranes are moist. No oral lesions.     Pharynx: No pharyngeal swelling or posterior oropharyngeal erythema.  Cardiovascular:     Rate and Rhythm: Normal rate and regular rhythm.     Heart sounds: No murmur heard.    No friction rub. No gallop.  Pulmonary:     Effort: Pulmonary effort is normal.  Breath sounds: Normal breath sounds. No wheezing, rhonchi or rales.  Musculoskeletal:     Cervical back: Neck supple. No rigidity.  Lymphadenopathy:     Cervical: No cervical adenopathy.  Neurological:     Mental Status: She is alert.    Recent Results (from the past 2160 hours)  POC SOFIA 2 FLU + SARS ANTIGEN FIA     Status: Normal   Collection Time: 02/11/24  9:57 AM  Result Value Ref Range   Influenza A, POC Negative Negative   Influenza B, POC Negative Negative   SARS Coronavirus 2 Ag Negative Negative      Assessment/Plan: 1. Acute upper respiratory infection (Primary) Suspect viral. POC Flu/COVID antigen tests negative. Discussed  supportive measures and OTC symptomatic treatment. Encouraged to contact provider or schedule follow up visit as needed for new/worsening symptoms or if symptoms not improving over next 5-7 days as discussed.   - POC SOFIA 2 FLU + SARS ANTIGEN FIA   Patient Instructions  -Rest and stay well hydrated (by drinking water and other liquids). Avoid/limit caffeine. -Do not smoke when recovering from illness. -Take over-the-counter medicines (i.e. Alka Seltzer Cold and Flu medicine, Mucinex) to help relieve your symptoms. -For your sore throat/cough, use cough drops/throat lozenges, gargle warm salt water and/or drink warm liquids (like tea with honey). -Send MyChart message to provider or schedule return visit as needed for new/worsening symptoms or if symptoms do not improve as discussed with recommended treatment.    General Counseling: Farran verbalizes understanding of the findings of todays visit and plan of treatment. she has been encouraged to call the office with any questions or concerns that should arise related to todays visit.   Joen Arts PA-C Physician Assistant

## 2024-02-11 NOTE — Patient Instructions (Signed)
-  Rest and stay well hydrated (by drinking water and other liquids). Avoid/limit caffeine. -Do not smoke when recovering from illness. -Take over-the-counter medicines (i.e. Alka Seltzer Cold and Flu medicine, Mucinex) to help relieve your symptoms. -For your sore throat/cough, use cough drops/throat lozenges, gargle warm salt water and/or drink warm liquids (like tea with honey). -Send MyChart message to provider or schedule return visit as needed for new/worsening symptoms or if symptoms do not improve as discussed with recommended treatment.

## 2024-02-22 ENCOUNTER — Ambulatory Visit: Payer: Self-pay | Admitting: Adult Health

## 2024-02-22 ENCOUNTER — Encounter: Payer: Self-pay | Admitting: Adult Health

## 2024-02-22 VITALS — BP 110/80 | HR 91 | Temp 97.6°F | Ht 66.0 in

## 2024-02-22 DIAGNOSIS — R062 Wheezing: Secondary | ICD-10-CM

## 2024-02-22 DIAGNOSIS — J22 Unspecified acute lower respiratory infection: Secondary | ICD-10-CM

## 2024-02-22 MED ORDER — ALBUTEROL SULFATE HFA 108 (90 BASE) MCG/ACT IN AERS: INHALATION_SPRAY | RESPIRATORY_TRACT | 0 refills | Status: AC

## 2024-02-22 MED ORDER — PREDNISONE 10 MG PO TABS: ORAL_TABLET | ORAL | 0 refills | Status: AC

## 2024-02-22 MED ORDER — AZITHROMYCIN 250 MG PO TABS: ORAL_TABLET | ORAL | 0 refills | Status: AC

## 2024-02-22 NOTE — Progress Notes (Signed)
 [facility] [address] [place], [place] [address]   Office Visit Note  Patient Name: Kathleen Leach Date of Birth [birth year]  Medical Record number [id number]  Date of Service: 02/22/2024  Chief Complaint  Patient presents with   Acute Visit    Patient seen about 10 days ago. Cold symptoms seemed to improve but dry cough returned within a few days. She reports occasional SOB. She was taking Robitussin which gave her temporary relief from cough.     HPI Pt is here for a sick visit. She was seen on 12/29, see previous Note. She reports since then she has been having cough, and had some mild head congestion.   Denies fever, chills, sore throat.  She reports coughing at night with sleeping. Denies productivity.    Current Medication:  Outpatient Encounter Medications as of 02/22/2024  Medication Sig   albuterol  (VENTOLIN  HFA) 108 (90 Base) MCG/ACT inhaler Inhale 2 puffs three times daily for one week.  Then use 2 puffs every 4-6 hours as needed for cough   azithromycin  (ZITHROMAX ) 250 MG tablet Take 2 tablets on day 1, then 1 tablet daily on days 2 through 5   levonorgestrel  (MIRENA , 52 MG,) 20 MCG/DAY IUD 1 each by Intrauterine route once. Inserted on 10/07/2020   predniSONE  (DELTASONE ) 10 MG tablet Take 6 tablets (60 mg total) by mouth daily with breakfast for 1 day, THEN 5 tablets (50 mg total) daily with breakfast for 1 day, THEN 4 tablets (40 mg total) daily with breakfast for 1 day, THEN 3 tablets (30 mg total) daily with breakfast for 1 day, THEN 2 tablets (20 mg total) daily with breakfast for 1 day, THEN 1 tablet (10 mg total) daily with breakfast for 1 day.   No facility-administered encounter medications on file as of 02/22/2024.      Medical History: Past Medical History:  Diagnosis Date   Gestational diabetes    during pregnancy   HELLP syndrome    HPV in female    Preeclampsia 2012   Scoliosis      Vital Signs: BP 110/80   Pulse 91   Temp 97.6 F (36.4  C)   Ht 5' 6 (1.676 m)   SpO2 99%   BMI 29.86 kg/m    Review of Systems  Constitutional:  Negative for chills, fatigue and fever.  HENT:  Negative for congestion and sore throat.   Eyes:  Negative for pain and itching.  Respiratory:  Positive for cough and chest tightness.   Cardiovascular:  Negative for chest pain.  Gastrointestinal:  Negative for diarrhea, nausea and vomiting.    Physical Exam Vitals reviewed.  Constitutional:      Appearance: Normal appearance.  HENT:     Head: Normocephalic.     Nose: Nose normal.  Eyes:     Pupils: Pupils are equal, round, and reactive to light.  Cardiovascular:     Rate and Rhythm: Normal rate.  Pulmonary:     Effort: Pulmonary effort is normal.     Breath sounds: Wheezing present.  Lymphadenopathy:     Cervical: No cervical adenopathy.  Neurological:     Mental Status: She is alert.    Assessment/Plan: 1. Lower resp. tract infection (Primary) Take Zpak as discussed. Use prednisone  and albuterol  as prescribed. Follow up as discussed.  - albuterol  (VENTOLIN  HFA) 108 (90 Base) MCG/ACT inhaler; Inhale 2 puffs three times daily for one week.  Then use 2 puffs every 4-6 hours as  needed for cough  Dispense: 8 g; Refill: 0 - azithromycin  (ZITHROMAX ) 250 MG tablet; Take 2 tablets on day 1, then 1 tablet daily on days 2 through 5  Dispense: 6 tablet; Refill: 0 - predniSONE  (DELTASONE ) 10 MG tablet; Take 6 tablets (60 mg total) by mouth daily with breakfast for 1 day, THEN 5 tablets (50 mg total) daily with breakfast for 1 day, THEN 4 tablets (40 mg total) daily with breakfast for 1 day, THEN 3 tablets (30 mg total) daily with breakfast for 1 day, THEN 2 tablets (20 mg total) daily with breakfast for 1 day, THEN 1 tablet (10 mg total) daily with breakfast for 1 day.  Dispense: 21 tablet; Refill: 0  2. Wheezing Take Inhaler as discussed - albuterol  (VENTOLIN  HFA) 108 (90 Base) MCG/ACT inhaler; Inhale 2 puffs three times daily for one week.   Then use 2 puffs every 4-6 hours as needed for cough  Dispense: 8 g; Refill: 0 - predniSONE  (DELTASONE ) 10 MG tablet; Take 6 tablets (60 mg total) by mouth daily with breakfast for 1 day, THEN 5 tablets (50 mg total) daily with breakfast for 1 day, THEN 4 tablets (40 mg total) daily with breakfast for 1 day, THEN 3 tablets (30 mg total) daily with breakfast for 1 day, THEN 2 tablets (20 mg total) daily with breakfast for 1 day, THEN 1 tablet (10 mg total) daily with breakfast for 1 day.  Dispense: 21 tablet; Refill: 0     General Counseling: Kathleen Leach verbalizes understanding of the findings of todays visit and agrees with plan of treatment. I have discussed any further diagnostic evaluation that may be needed or ordered today. We also reviewed her medications today. she has been encouraged to call the office with any questions or concerns that should arise related to todays visit.   No orders of the defined types were placed in this encounter.   Meds ordered this encounter  Medications   albuterol  (VENTOLIN  HFA) 108 (90 Base) MCG/ACT inhaler    Sig: Inhale 2 puffs three times daily for one week.  Then use 2 puffs every 4-6 hours as needed for cough    Dispense:  8 g    Refill:  0   azithromycin  (ZITHROMAX ) 250 MG tablet    Sig: Take 2 tablets on day 1, then 1 tablet daily on days 2 through 5    Dispense:  6 tablet    Refill:  0   predniSONE  (DELTASONE ) 10 MG tablet    Sig: Take 6 tablets (60 mg total) by mouth daily with breakfast for 1 day, THEN 5 tablets (50 mg total) daily with breakfast for 1 day, THEN 4 tablets (40 mg total) daily with breakfast for 1 day, THEN 3 tablets (30 mg total) daily with breakfast for 1 day, THEN 2 tablets (20 mg total) daily with breakfast for 1 day, THEN 1 tablet (10 mg total) daily with breakfast for 1 day.    Dispense:  21 tablet    Refill:  0    Time spent:15 Minutes    Kathleen Leach AGNP-C Nurse Practitioner

## 2024-03-05 ENCOUNTER — Ambulatory Visit: Payer: Self-pay | Admitting: General Practice

## 2024-03-18 ENCOUNTER — Ambulatory Visit: Payer: Self-pay | Admitting: General Practice

## 2024-03-26 ENCOUNTER — Ambulatory Visit: Admitting: General Practice
